# Patient Record
Sex: Male | Born: 1980 | Race: White | Hispanic: No | Marital: Married | Smoking: Former smoker
Health system: Southern US, Community
[De-identification: ages and names within clinical notes are randomized; demographics above are authoritative.]

## PROBLEM LIST (undated history)

## (undated) DIAGNOSIS — K219 Gastro-esophageal reflux disease without esophagitis: Secondary | ICD-10-CM

## (undated) DIAGNOSIS — F418 Other specified anxiety disorders: Secondary | ICD-10-CM

## (undated) HISTORY — DX: Gastro-esophageal reflux disease without esophagitis: K21.9

## (undated) HISTORY — DX: Other specified anxiety disorders: F41.8

---

## 2006-10-12 ENCOUNTER — Ambulatory Visit: Payer: Self-pay | Admitting: Family Medicine

## 2006-11-09 ENCOUNTER — Ambulatory Visit: Payer: Self-pay | Admitting: Family Medicine

## 2006-11-11 ENCOUNTER — Emergency Department (HOSPITAL_COMMUNITY): Admission: EM | Admit: 2006-11-11 | Discharge: 2006-11-12 | Payer: Self-pay | Admitting: Emergency Medicine

## 2006-11-13 ENCOUNTER — Emergency Department (HOSPITAL_COMMUNITY): Admission: EM | Admit: 2006-11-13 | Discharge: 2006-11-13 | Payer: Self-pay | Admitting: Emergency Medicine

## 2007-02-03 ENCOUNTER — Ambulatory Visit: Payer: Self-pay | Admitting: Family Medicine

## 2007-02-16 ENCOUNTER — Ambulatory Visit: Payer: Self-pay | Admitting: Family Medicine

## 2007-04-18 ENCOUNTER — Emergency Department (HOSPITAL_COMMUNITY): Admission: EM | Admit: 2007-04-18 | Discharge: 2007-04-19 | Payer: Self-pay | Admitting: *Deleted

## 2008-02-11 ENCOUNTER — Emergency Department (HOSPITAL_COMMUNITY): Admission: EM | Admit: 2008-02-11 | Discharge: 2008-02-11 | Payer: Self-pay | Admitting: Emergency Medicine

## 2008-04-03 ENCOUNTER — Emergency Department (HOSPITAL_COMMUNITY): Admission: EM | Admit: 2008-04-03 | Discharge: 2008-04-03 | Payer: Self-pay | Admitting: Family Medicine

## 2008-06-07 ENCOUNTER — Ambulatory Visit: Payer: Self-pay | Admitting: Occupational Medicine

## 2008-09-23 ENCOUNTER — Emergency Department (HOSPITAL_BASED_OUTPATIENT_CLINIC_OR_DEPARTMENT_OTHER): Admission: EM | Admit: 2008-09-23 | Discharge: 2008-09-23 | Payer: Self-pay | Admitting: Emergency Medicine

## 2009-06-07 ENCOUNTER — Ambulatory Visit: Payer: Self-pay | Admitting: Gastroenterology

## 2009-06-07 ENCOUNTER — Encounter (INDEPENDENT_AMBULATORY_CARE_PROVIDER_SITE_OTHER): Payer: Self-pay | Admitting: *Deleted

## 2009-06-07 DIAGNOSIS — K219 Gastro-esophageal reflux disease without esophagitis: Secondary | ICD-10-CM | POA: Insufficient documentation

## 2009-06-10 ENCOUNTER — Ambulatory Visit: Payer: Self-pay | Admitting: Occupational Medicine

## 2009-06-10 LAB — CONVERTED CEMR LAB: Rapid Strep: NEGATIVE

## 2009-06-19 ENCOUNTER — Ambulatory Visit: Payer: Self-pay | Admitting: Gastroenterology

## 2009-06-19 ENCOUNTER — Encounter: Payer: Self-pay | Admitting: Gastroenterology

## 2009-06-21 ENCOUNTER — Encounter: Payer: Self-pay | Admitting: Gastroenterology

## 2009-06-26 ENCOUNTER — Ambulatory Visit: Payer: Self-pay | Admitting: Family Medicine

## 2009-06-26 DIAGNOSIS — J019 Acute sinusitis, unspecified: Secondary | ICD-10-CM | POA: Insufficient documentation

## 2009-09-02 ENCOUNTER — Ambulatory Visit: Payer: Self-pay | Admitting: Family Medicine

## 2010-01-18 ENCOUNTER — Emergency Department (HOSPITAL_BASED_OUTPATIENT_CLINIC_OR_DEPARTMENT_OTHER): Admission: EM | Admit: 2010-01-18 | Discharge: 2010-01-18 | Payer: Self-pay | Admitting: Emergency Medicine

## 2010-01-18 ENCOUNTER — Encounter: Payer: Self-pay | Admitting: Family Medicine

## 2010-08-07 ENCOUNTER — Ambulatory Visit: Payer: Self-pay | Admitting: Emergency Medicine

## 2010-08-07 DIAGNOSIS — L738 Other specified follicular disorders: Secondary | ICD-10-CM | POA: Insufficient documentation

## 2010-08-09 ENCOUNTER — Emergency Department (HOSPITAL_BASED_OUTPATIENT_CLINIC_OR_DEPARTMENT_OTHER)
Admission: EM | Admit: 2010-08-09 | Discharge: 2010-08-09 | Payer: Self-pay | Source: Home / Self Care | Admitting: Emergency Medicine

## 2010-08-11 ENCOUNTER — Telehealth (INDEPENDENT_AMBULATORY_CARE_PROVIDER_SITE_OTHER): Payer: Self-pay | Admitting: *Deleted

## 2010-09-16 NOTE — Assessment & Plan Note (Signed)
Summary: VOMIT/DIARRHEA/TJ    Current Allergies: No known allergies  Assessment PATIENT PROCEEDED TO ER  The patient and/or caregiver has been counseled thoroughly with regard to medications prescribed including dosage, schedule, interactions, rationale for use, and possible side effects and they verbalize understanding.  Diagnoses and expected course of recovery discussed and will return if not improved as expected or if the condition worsens. Patient and/or caregiver verbalized understanding.

## 2010-09-18 NOTE — Assessment & Plan Note (Signed)
Summary: SORE ON TOP OF HEAD/TJ   Vital Signs:  Patient Profile:   30 Years Old Male O2 treatment:    Room Air (left arm) Cuff size:   regular  Vitals Entered By: Lajean Saver RN (August 07, 2010 5:48 PM)                  Updated Prior Medication List: NEXIUM 40 MG CPDR (ESOMEPRAZOLE MAGNESIUM) 1 tab by mouth once daily  Current Allergies: No known allergies History of Present Illness History of Present Illness: Sore on the L top of his head for 4 days.  He doesn't know how it got there.  Red and swollen with some bumps.  He does cut his own hair and cut it about a week ago.  Also used hair gel.  Then in the past 1-2 days has noticed L ear discomfort on the same side.  No F/C.  REVIEW OF SYSTEMS Constitutional Symptoms      Denies fever, chills, night sweats, weight loss, weight gain, and fatigue.  Eyes       Denies change in vision, eye pain, eye discharge, glasses, contact lenses, and eye surgery. Ear/Nose/Throat/Mouth       Complains of ear pain.      Denies hearing loss/aids, change in hearing, ear discharge, dizziness, frequent runny nose, frequent nose bleeds, sinus problems, sore throat, hoarseness, and tooth pain or bleeding.      Comments: left ear Respiratory       Denies dry cough, productive cough, wheezing, shortness of breath, asthma, bronchitis, and emphysema/COPD.  Cardiovascular       Denies murmurs, chest pain, and tires easily with exhertion.    Gastrointestinal       Denies stomach pain, nausea/vomiting, diarrhea, constipation, blood in bowel movements, and indigestion. Genitourniary       Denies painful urination, kidney stones, and loss of urinary control. Neurological       Denies paralysis, seizures, and fainting/blackouts. Musculoskeletal       Complains of redness and swelling.      Denies muscle pain, joint pain, joint stiffness, decreased range of motion, muscle weakness, and gout.      Comments: head Skin       Complains of unusual  moles/lumps or sores.      Denies bruising and hair/skin or nail changes.      Comments: head Psych       Denies mood changes, temper/anger issues, anxiety/stress, speech problems, depression, and sleep problems. Other Comments: Patient c/o sore to left frontal region of his head x 4 days. He is now having pain with eye movement and in front of his left ear   Past History:  Past Medical History: GERD  Past Surgical History: Reviewed history from 06/07/2009 and no changes required. None   Family History: Reviewed history from 06/07/2008 and no changes required. Mother- alive and healthy Family History Hypertension - Father deceased Family History of Cardiovascular disorder  Social History: quit dipping tobacco a year ago drinks a 12 pack in a week 4-5 caffinated drinks a day. married, children He works as a Government social research officer, traveling a lot on the weekends Physical Exam General appearance: well developed, well nourished, no acute distress Head: slightly raised erythematous area on L fronto-temporal area of head with mild irritation of hair follicles.  No induration or fluctuance is felt. Ears: normal, no lesions or deformities.  +Left anterior auricular LAD Skin: see above, no other rashes are seen MSE: oriented to time,  place, and person Assessment New Problems: FOLLICULITIS (ICD-704.8)  likely bacterial folliculitis, doubt fungal  Plan New Medications/Changes: DOXYCYCLINE HYCLATE 100 MG CAPS (DOXYCYCLINE HYCLATE) 1 by mouth two times a day for 10 days  #20 x 0, 08/07/2010, Hoyt Koch MD  New Orders: Est. Patient Level III [phone] Planning Comments:   Rx for Doxy given for 10 days.  Keep area clean and dry.  Hold off on using gel for this week.  May use OTC cortisone cream for itching/irritation as needed.  If not improving in the next few days, call PCP.    The patient and/or caregiver has been counseled thoroughly with regard to medications prescribed  including dosage, schedule, interactions, rationale for use, and possible side effects and they verbalize understanding.  Diagnoses and expected course of recovery discussed and will return if not improved as expected or if the condition worsens. Patient and/or caregiver verbalized understanding.  Prescriptions: DOXYCYCLINE HYCLATE 100 MG CAPS (DOXYCYCLINE HYCLATE) 1 by mouth two times a day for 10 days  #20 x 0   Entered and Authorized by:   Hoyt Koch MD   Signed by:   Hoyt Koch MD on 08/07/2010   Method used:   Print then Give to Patient   RxID:   [id number]   Orders Added: 1)  Est. Patient Level III [32440]  Appended Document: SORE ON TOP OF HEAD/TJ V/S: 98.9 F 144/87 HR: 69 RR:16 99% 171lbs

## 2010-09-18 NOTE — Progress Notes (Signed)
  Phone Note Outgoing Call   Call placed by: Lajean Saver RN,  August 11, 2010 5:40 PM Call placed to: Patient Summary of Call: Callback: No answer. Message left with reason for call and call back with any questions or concerns

## 2010-11-03 LAB — BASIC METABOLIC PANEL
CO2: 22 mEq/L (ref 19–32)
Calcium: 9.9 mg/dL (ref 8.4–10.5)
Creatinine, Ser: 1.6 mg/dL — ABNORMAL HIGH (ref 0.4–1.5)
GFR calc Af Amer: >60 mL/min (ref >60)
GFR calc non Af Amer: 51 mL/min — ABNORMAL LOW (ref >60)
Sodium: 145 mEq/L (ref 135–145)

## 2011-04-07 ENCOUNTER — Inpatient Hospital Stay (INDEPENDENT_AMBULATORY_CARE_PROVIDER_SITE_OTHER)
Admission: RE | Admit: 2011-04-07 | Discharge: 2011-04-07 | Disposition: A | Discharge status: Home / Self Care | Payer: 59 | Source: Ambulatory Visit | Attending: Emergency Medicine | Admitting: Emergency Medicine

## 2011-04-07 ENCOUNTER — Encounter: Payer: Self-pay | Admitting: Emergency Medicine

## 2011-04-07 DIAGNOSIS — R509 Fever, unspecified: Secondary | ICD-10-CM

## 2011-04-07 DIAGNOSIS — R5383 Other fatigue: Secondary | ICD-10-CM

## 2011-04-07 DIAGNOSIS — R5381 Other malaise: Secondary | ICD-10-CM

## 2011-04-07 DIAGNOSIS — J069 Acute upper respiratory infection, unspecified: Secondary | ICD-10-CM

## 2011-04-07 LAB — CONVERTED CEMR LAB: Rapid Strep: NEGATIVE

## 2011-04-28 ENCOUNTER — Other Ambulatory Visit: Payer: Self-pay | Admitting: Family Medicine

## 2011-04-28 ENCOUNTER — Ambulatory Visit (HOSPITAL_COMMUNITY)
Admission: RE | Admit: 2011-04-28 | Discharge: 2011-04-28 | Disposition: A | Discharge status: Home / Self Care | Payer: 59 | Source: Ambulatory Visit | Attending: Family Medicine | Admitting: Family Medicine

## 2011-04-28 ENCOUNTER — Encounter: Payer: Self-pay | Admitting: Family Medicine

## 2011-04-28 ENCOUNTER — Ambulatory Visit
Admission: RE | Admit: 2011-04-28 | Discharge: 2011-04-28 | Disposition: A | Discharge status: Home / Self Care | Payer: 59 | Source: Ambulatory Visit | Attending: Family Medicine | Admitting: Family Medicine

## 2011-04-28 ENCOUNTER — Inpatient Hospital Stay (INDEPENDENT_AMBULATORY_CARE_PROVIDER_SITE_OTHER)
Admission: RE | Admit: 2011-04-28 | Discharge: 2011-04-28 | Disposition: A | Discharge status: Home / Self Care | Payer: 59 | Source: Ambulatory Visit | Attending: Family Medicine | Admitting: Family Medicine

## 2011-04-28 DIAGNOSIS — R059 Cough, unspecified: Secondary | ICD-10-CM

## 2011-04-28 DIAGNOSIS — R05 Cough: Secondary | ICD-10-CM

## 2011-04-28 DIAGNOSIS — J209 Acute bronchitis, unspecified: Secondary | ICD-10-CM

## 2011-04-30 ENCOUNTER — Telehealth (INDEPENDENT_AMBULATORY_CARE_PROVIDER_SITE_OTHER): Payer: Self-pay | Admitting: Emergency Medicine

## 2011-05-04 ENCOUNTER — Encounter: Payer: Self-pay | Admitting: Family Medicine

## 2011-05-05 ENCOUNTER — Ambulatory Visit (INDEPENDENT_AMBULATORY_CARE_PROVIDER_SITE_OTHER): Payer: 59 | Admitting: Family Medicine

## 2011-05-05 ENCOUNTER — Encounter: Payer: Self-pay | Admitting: Family Medicine

## 2011-05-05 VITALS — BP 140/86 | HR 78 | Wt 156.0 lb

## 2011-05-05 DIAGNOSIS — J209 Acute bronchitis, unspecified: Secondary | ICD-10-CM

## 2011-05-05 DIAGNOSIS — S6980XA Other specified injuries of unspecified wrist, hand and finger(s), initial encounter: Secondary | ICD-10-CM

## 2011-05-05 DIAGNOSIS — IMO0001 Reserved for inherently not codable concepts without codable children: Secondary | ICD-10-CM

## 2011-05-05 DIAGNOSIS — S6990XA Unspecified injury of unspecified wrist, hand and finger(s), initial encounter: Secondary | ICD-10-CM

## 2011-05-05 MED ORDER — AMOXICILLIN-POT CLAVULANATE 875-125 MG PO TABS: 1.0000 | ORAL_TABLET | Freq: Two times a day (BID) | ORAL | Status: AC

## 2011-05-05 NOTE — Progress Notes (Signed)
  Subjective:    Patient ID: John Leach, male    DOB: 27-Jan-1981, 30 y.o.   MRN: [id number]  HPI He injured his left fourth finger trying to catch a football several weeks ago. He is now unable to fully extend at the DIP joint. He has been using a homemade splint to help with this. He also complains of a several week history of productive cough, congestion. He was seen twice in an urgent care Center. Initially he was placed on azithromycin. The chest x-ray was apparently negative. He was then switched to Biaxin. He has made no progress with his symptoms. He has no fever, chills, sore throat or earache. He does not smoke.   Review of Systems     Objective:   Physical Exam alert and in no distress. Tympanic membranes and canals are normal. Throat is clear. Tonsils are normal. Neck is supple without adenopathy or thyromegaly. Cardiac exam shows a regular sinus rhythm without murmurs or gallops. Lungs show scattered rhonchi especially on full expiration. Sam of the left ring finger does show inability to fully extend at the DIP joint. No tenderness to palpation or joint swelling is noted.       Assessment & Plan:   1. Bronchitis, acute    2. Injury of fourth finger of left hand  Ambulatory referral to Orthopedic Surgery   I will place him on Augmentin for 2 weeks he is to call me at that time and let me know he is doing. Continue to wear the splint and see orthopedics.

## 2011-07-06 ENCOUNTER — Emergency Department: Admit: 2011-07-06 | Discharge: 2011-07-06 | Disposition: A | Discharge status: Home / Self Care | Payer: 59

## 2011-07-06 ENCOUNTER — Emergency Department
Admission: EM | Admit: 2011-07-06 | Discharge: 2011-07-06 | Disposition: A | Discharge status: Home / Self Care | Payer: 59 | Source: Home / Self Care | Attending: Emergency Medicine | Admitting: Emergency Medicine

## 2011-07-06 ENCOUNTER — Encounter: Payer: Self-pay | Admitting: Emergency Medicine

## 2011-07-06 DIAGNOSIS — S92909A Unspecified fracture of unspecified foot, initial encounter for closed fracture: Secondary | ICD-10-CM

## 2011-07-06 DIAGNOSIS — M79609 Pain in unspecified limb: Secondary | ICD-10-CM

## 2011-07-06 DIAGNOSIS — M79671 Pain in right foot: Secondary | ICD-10-CM

## 2011-07-06 DIAGNOSIS — S92901A Unspecified fracture of right foot, initial encounter for closed fracture: Secondary | ICD-10-CM

## 2011-07-06 NOTE — ED Provider Notes (Signed)
History     CSN: [id number] Arrival date & time: 07/06/2011  8:33 AM   First MD Initiated Contact with Patient 07/06/11 [phone]      Chief Complaint  Patient presents with  . Foot Injury    (Consider location/radiation/quality/duration/timing/severity/associated sxs/prior treatment) Patient is a 30 y.o. male presenting with foot injury.  Foot Injury    This is a 30 year old male who presents with a right foot injury since yesterday.  He was standing on the stool and stepped off and twisted his ankle and landed on the lateral aspect of his foot.  Since then he has had pain.  This morning when he tried to get a bed and stepped on his foot, he felt a lot more pain.  He has been taking Tylenol and using a little ice which has been helping.  He does not have any history of any right foot trauma.  Past Medical History  Diagnosis Date  . GERD (gastroesophageal reflux disease)   . COPD (chronic obstructive pulmonary disease)     FAM HX    History reviewed. No pertinent past surgical history.  History reviewed. No pertinent family history.  History  Substance Use Topics  . Smoking status: Never Smoker   . Smokeless tobacco: Never Used  . Alcohol Use: Not on file      Review of Systems  Allergies  Review of patient's allergies indicates no known allergies.  Home Medications   Current Outpatient Rx  Name Route Sig Dispense Refill  . ACETAMINOPHEN 500 MG PO TABS Oral Take 500 mg by mouth every 6 (six) hours as needed.        BP 137/89  Pulse 75  Temp(Src) 99 F (37.2 C) (Oral)  Resp 12  Ht 5\' 9"  (1.753 m)  Wt 166 lb (75.297 kg)  BMI 24.51 kg/m2  SpO2 100%  Physical Exam  Nursing note and vitals reviewed. Constitutional: He is oriented to person, place, and time. He appears well-developed and well-nourished.  HENT:  Head: Normocephalic and atraumatic.  Neck: Neck supple.  Cardiovascular: Regular rhythm and normal heart sounds.   Pulmonary/Chest: Effort normal and  breath sounds normal. No respiratory distress.  Musculoskeletal:       R ankle/foot: FROM, +TTP 5th metatarsal and lateral foot and associated swelling.   No TTP medial/lateral malleolus, navicular, calcaneus, Achilles, proximal fibula.  No swelling.  No ecchymoses.  Distal neurovascular status is intact.   Neurological: He is alert and oriented to person, place, and time.  Skin: Skin is warm and dry.  Psychiatric: He has a normal mood and affect. His speech is normal.     ED Course  Procedures (including critical care time)  Labs Reviewed - No data to display No results found.   1. Right foot pain       MDM  An X-ray was ordered and read by the radiologist as "transverse proximal fracture of the 5th metatarsal".  This may be a Yetta Barre' fracture or is very close.  Either way, will put him NWB on crutches and in a boot and set him up with Dr. Margaretha Sheffield or Lajoyce Corners.  Encourage rest, ice, compression with ACE bandage, and elevation of injured body part. NSAIDs PRN.    Lily Kocher, MD 07/06/11 [phone]

## 2011-07-06 NOTE — ED Notes (Signed)
Rt foot injury yesterday slipped off a foot stool, iced and elevated took tylenol.

## 2011-07-20 NOTE — Progress Notes (Signed)
Summary: fever,muscle & joint pain...wse   Vital Signs:  Patient Profile:   30 Years Old Male CC:      fever, achy joints x 1 day Height:     68 inches Weight:      158 pounds O2 Sat:      100 % O2 treatment:    Room Air Temp:     101.1 degrees F oral Pulse rate:   68 / minute Resp:     16 per minute BP sitting:   144 / 91  (left arm) Cuff size:   regular  Vitals Entered By: Clemens Catholic LPN (April 07, 2011 6:22 PM)                  Updated Prior Medication List: No Medications Current Allergies (reviewed today): No known allergies History of Present Illness History from: patient Chief Complaint: fever, achy joints x 1 day History of Present Illness: 30 Years Old Male complains of onset of cold symptoms for 1 days.  Taji has been using Tylenol which is helping a little bit.  He has 2 young kids at home. No sore throat No cough No pleuritic pain No wheezing No nasal congestion No post-nasal drainage No sinus pain/pressure No chest congestion No itchy/red eyes No earache No hemoptysis No SOB +chills/sweats + fever No nausea No vomiting No abdominal pain No diarrhea No skin rashes + fatigue +myalgias +headache   REVIEW OF SYSTEMS Constitutional Symptoms       Complains of fever, chills, and night sweats.     Denies weight loss, weight gain, and fatigue.  Eyes       Denies change in vision, eye pain, eye discharge, glasses, contact lenses, and eye surgery. Ear/Nose/Throat/Mouth       Denies hearing loss/aids, change in hearing, ear pain, ear discharge, dizziness, frequent runny nose, frequent nose bleeds, sinus problems, sore throat, hoarseness, and tooth pain or bleeding.  Respiratory       Denies dry cough, productive cough, wheezing, shortness of breath, asthma, bronchitis, and emphysema/COPD.  Cardiovascular       Denies murmurs, chest pain, and tires easily with exhertion.    Gastrointestinal       Denies stomach pain, nausea/vomiting,  diarrhea, constipation, blood in bowel movements, and indigestion. Genitourniary       Denies painful urination, kidney stones, and loss of urinary control. Neurological       Complains of headaches.      Denies paralysis, seizures, and fainting/blackouts. Musculoskeletal       Complains of muscle pain and joint pain.      Denies joint stiffness, decreased range of motion, redness, swelling, muscle weakness, and gout.  Skin       Denies bruising, unusual mles/lumps or sores, and hair/skin or nail changes.  Psych       Denies mood changes, temper/anger issues, anxiety/stress, speech problems, depression, and sleep problems. Other Comments: pt c/o fever x last night, achy joints and LBP x today. no pain w/ urination. he has taken Tylenol.   Past History:  Past Medical History: Reviewed history from 08/07/2010 and no changes required. GERD  Past Surgical History: Reviewed history from 06/07/2009 and no changes required. None   Family History: Mother- alive and healthy Family History Hypertension - Father deceased [place] age 16 Family History of Cardiovascular disorder  Social History: quit dipping tobacco a year ago drinks 8-10 in a week 4-5 caffinated drinks a day. married, children He works as a Research scientist (life sciences)  Curator, traveling a lot on the weekends Physical Exam General appearance: well developed, well nourished, no acute distress Ears: normal, no lesions or deformities Nasal: mucosa pink, nonedematous, no septal deviation, turbinates normal Oral/Pharynx: clear PND, no erythema, no exudates Neck: ant cerv LAD nontender Chest/Lungs: no rales, wheezes, or rhonchi bilateral, breath sounds equal without effort Heart: regular rate and  rhythm, no murmur MSE: oriented to time, place, and person Assessment New Problems: FEVER (ICD-780.60) FATIGUE-MALAISE (ICD-780.79) UPPER RESPIRATORY INFECTION, ACUTE (ICD-465.9)   Plan New Medications/Changes: PREDNISONE (PAK) 10 MG TABS  (PREDNISONE) 6 day pack, use as directed  #1 x 0, 04/07/2011, Hoyt Koch MD  New Orders: Est. Patient Level III [40981] Rapid Strep [19147] Planning Comments:   1) NO antiobiotics given since likely viral.  Rapid strep negative.  Gave Rx for Pred instead.  Use nasal saline solution (over the counter) at least 3 times a day.Can take tylenol every 6 hours or motrin every 8 hours for pain or fever.  Follow up with your primary doctor  if no improvement in 5-7 days, sooner if increasing pain, fever, or new symptoms.  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   The patient and/or caregiver has been counseled thoroughly with regard to medications prescribed including dosage, schedule, interactions, rationale for use, and possible side effects and they verbalize understanding.  Diagnoses and expected course of recovery discussed and will return if not improved as expected or if the condition worsens. Patient and/or caregiver verbalized understanding.  Prescriptions: PREDNISONE (PAK) 10 MG TABS (PREDNISONE) 6 day pack, use as directed  #1 x 0   Entered and Authorized by:   Hoyt Koch MD   Signed by:   Hoyt Koch MD on 04/07/2011   Method used:   Print then Give to Patient   RxID:   [id number]   Orders Added: 1)  Est. Patient Level III [96295] 2)  Rapid Strep [28413]    Laboratory Results  Date/Time Received: April 07, 2011 6:39 PM  Date/Time Reported: April 07, 2011 6:39 PM   Other Tests  Rapid Strep: negative  Kit Test Internal QC: Negative   (Normal Range: Negative)

## 2011-07-20 NOTE — Progress Notes (Signed)
Summary: COUGH, CHEST CONGESTION...WSE rm 4   Vital Signs:  Patient Profile:   30 Years Old Male CC:      cough and chest congestion x 3 wks Height:     68 inches Weight:      159 pounds O2 Sat:      99 % O2 treatment:    Room Air Temp:     99.1 degrees F oral Pulse rate:   56 / minute Resp:     16 per minute BP sitting:   137 / 88  (left arm) Cuff size:   regular  Vitals Entered By: Clemens Catholic LPN (April 28, 2011 6:15 PM)                  Updated Prior Medication List: No Medications Current Allergies (reviewed today): No known allergies History of Present Illness Chief Complaint: cough and chest congestion x 3 wks History of Present Illness:  Subjective:  Patient complains of persistent non-productive cough for 3 weeks, generally worse at night.  He has occasional wheezing, and mild shortness of breath with activity.  No fevers, chills, and sweats.  He has had some persistent mild right nasal congestion.  REVIEW OF SYSTEMS Constitutional Symptoms      Denies fever, chills, night sweats, weight loss, weight gain, and fatigue.  Eyes       Denies change in vision, eye pain, eye discharge, glasses, contact lenses, and eye surgery. Ear/Nose/Throat/Mouth       Denies hearing loss/aids, change in hearing, ear pain, ear discharge, dizziness, frequent runny nose, frequent nose bleeds, sinus problems, sore throat, hoarseness, and tooth pain or bleeding.  Respiratory       Complains of dry cough, productive cough, and wheezing.      Denies shortness of breath, asthma, bronchitis, and emphysema/COPD.  Cardiovascular       Denies murmurs, chest pain, and tires easily with exhertion.      Comments: chest hurts   Gastrointestinal       Denies stomach pain, nausea/vomiting, diarrhea, constipation, blood in bowel movements, and indigestion. Genitourniary       Denies painful urination, kidney stones, and loss of urinary control. Neurological       Denies paralysis,  seizures, and fainting/blackouts. Musculoskeletal       Denies muscle pain, joint pain, joint stiffness, decreased range of motion, redness, swelling, muscle weakness, and gout.  Skin       Denies bruising, unusual mles/lumps or sores, and hair/skin or nail changes.  Psych       Denies mood changes, temper/anger issues, anxiety/stress, speech problems, depression, and sleep problems. Other Comments: pt c/o chest congestion, cough(mostly dry, when productive phelgm is green/dark yellow) x 3 wks. he has taken Mucinex and ASA with no relief.   Past History:  Past Medical History: Reviewed history from 08/07/2010 and no changes required. GERD  Past Surgical History: Reviewed history from 06/07/2009 and no changes required. None   Family History: Reviewed history from 04/07/2011 and no changes required. Mother- alive and healthy Family History Hypertension - Father deceased [place] age 25 Family History of Cardiovascular disorder  Social History: Reviewed history from 04/07/2011 and no changes required. quit dipping tobacco a year ago drinks 8-10 in a week 4-5 caffinated drinks a day. married, children He works as a Government social research officer, traveling a lot on the weekends   Objective:  Appearance:  Patient appears healthy, stated age, and in no acute distress  Eyes:  Pupils are equal,  round, and reactive to light and accomodation.  Extraocular movement is intact.  Conjunctivae are not inflamed.  Ears:  Canals normal.  Tympanic membranes normal.   Nose:  Mildly congested turbinates.  Mild right maxillary sinus tenderness  Pharynx:  Normal  Neck:  Supple.  No adenopathy is present.  Lungs:  Faint wheezes/rales left base.   Breath sounds are equal.  Heart:  Regular rate and rhythm without murmurs, rubs, or gallops.  Abdomen:  Nontender without masses or hepatosplenomegaly.  Bowel sounds are present.  No CVA or flank tenderness.  Extremities:  No edema. Skin:  No rash Chest X-ray:    IMPRESSION: Mild bronchial thickening, right greater than left suggestive of bronchitis. Assessment New Problems: BRONCHITIS, ACUTE (ICD-466.0) COUGH (ICD-786.2)   Plan New Medications/Changes: BENZONATATE 200 MG CAPS (BENZONATATE) One by mouth hs as needed cough  #12 x 0, 04/28/2011, Donna Christen MD CLARITHROMYCIN 500 MG TABS (CLARITHROMYCIN) One Tab by mouth two times a day  #20 x 0, 04/28/2011, Donna Christen MD  New Orders: T-Chest x-ray, 2 views [71020] Pulse Oximetry (single measurment) [94760] Services provided After hours-Weekends-Holidays [99051] Est. Patient Level IV [16109] Planning Comments:   Begin Biaxin, expectorant/decongestant, cough suppressant at bedtime.  Increase fluid intake Followup with PCP if not improving 7 to 10 days   The patient and/or caregiver has been counseled thoroughly with regard to medications prescribed including dosage, schedule, interactions, rationale for use, and possible side effects and they verbalize understanding.  Diagnoses and expected course of recovery discussed and will return if not improved as expected or if the condition worsens. Patient and/or caregiver verbalized understanding.  Prescriptions: BENZONATATE 200 MG CAPS (BENZONATATE) One by mouth hs as needed cough  #12 x 0   Entered and Authorized by:   Donna Christen MD   Signed by:   Donna Christen MD on 04/28/2011   Method used:   Print then Give to Patient   RxID:   [id number] CLARITHROMYCIN 500 MG TABS (CLARITHROMYCIN) One Tab by mouth two times a day  #20 x 0   Entered and Authorized by:   Donna Christen MD   Signed by:   Donna Christen MD on 04/28/2011   Method used:   Print then Give to Patient   RxID:   [id number]   Patient Instructions: 1)  Take Mucinex, or Mucinex D (guaifenesin with decongestant) twice daily for congestion. 2)  Increase fluid intake, rest. 3)  Followup with family doctor if not improving 7 to 10 days.   Orders Added: 1)  T-Chest  x-ray, 2 views [71020] 2)  Pulse Oximetry (single measurment) [94760] 3)  Services provided After hours-Weekends-Holidays [99051] 4)  Est. Patient Level IV [29528]

## 2011-07-20 NOTE — Telephone Encounter (Signed)
  Phone Note Outgoing Call   Call placed by: Lavell Islam RN,  April 30, 2011 6:10 PM Call placed to: Patient Action Taken: Phone Call Completed Summary of Call: Patient not feeling much better; discussed filling Rx for ABX and contacting PCP>

## 2011-08-10 ENCOUNTER — Other Ambulatory Visit (HOSPITAL_COMMUNITY): Payer: Self-pay | Admitting: Otolaryngology

## 2011-08-13 ENCOUNTER — Ambulatory Visit (HOSPITAL_COMMUNITY)
Admission: RE | Admit: 2011-08-13 | Discharge: 2011-08-13 | Disposition: A | Discharge status: Home / Self Care | Payer: 59 | Source: Ambulatory Visit | Attending: Otolaryngology | Admitting: Otolaryngology

## 2011-08-13 DIAGNOSIS — J322 Chronic ethmoidal sinusitis: Secondary | ICD-10-CM | POA: Insufficient documentation

## 2011-08-13 DIAGNOSIS — J342 Deviated nasal septum: Secondary | ICD-10-CM | POA: Insufficient documentation

## 2011-08-13 DIAGNOSIS — J3489 Other specified disorders of nose and nasal sinuses: Secondary | ICD-10-CM | POA: Insufficient documentation

## 2011-08-13 DIAGNOSIS — J320 Chronic maxillary sinusitis: Secondary | ICD-10-CM | POA: Insufficient documentation

## 2011-08-31 ENCOUNTER — Emergency Department
Admission: EM | Admit: 2011-08-31 | Discharge: 2011-08-31 | Disposition: A | Discharge status: Home / Self Care | Payer: 59 | Source: Home / Self Care

## 2011-08-31 DIAGNOSIS — Z23 Encounter for immunization: Secondary | ICD-10-CM

## 2011-08-31 MED ORDER — INFLUENZA VAC TYP A&B SURF ANT IM INJ
0.5000 mL | INJECTION | Freq: Once | INTRAMUSCULAR | Status: AC
  Administered 2011-08-31: 0.5 mL via INTRAMUSCULAR

## 2015-03-18 ENCOUNTER — Encounter: Payer: Self-pay | Admitting: *Deleted

## 2015-03-18 ENCOUNTER — Emergency Department (INDEPENDENT_AMBULATORY_CARE_PROVIDER_SITE_OTHER)
Admission: EM | Admit: 2015-03-18 | Discharge: 2015-03-18 | Disposition: A | Discharge status: Home / Self Care | Payer: BLUE CROSS/BLUE SHIELD | Source: Home / Self Care | Attending: Family Medicine | Admitting: Family Medicine

## 2015-03-18 DIAGNOSIS — J0120 Acute ethmoidal sinusitis, unspecified: Secondary | ICD-10-CM

## 2015-03-18 MED ORDER — PREDNISONE 20 MG PO TABS: 20.0000 mg | ORAL_TABLET | Freq: Two times a day (BID) | ORAL | Status: DC

## 2015-03-18 MED ORDER — AMOXICILLIN 875 MG PO TABS: 875.0000 mg | ORAL_TABLET | Freq: Two times a day (BID) | ORAL | Status: DC

## 2015-03-18 NOTE — ED Notes (Signed)
Pt c/o sinus pain and pressure x 1 wk. Denies fever or nasal congestion.

## 2015-03-18 NOTE — ED Provider Notes (Signed)
CSN: [id number]     Arrival date & time 03/18/15  1738 History   First MD Initiated Contact with Patient 03/18/15 1810     Chief Complaint  Patient presents with  . Facial Pain      HPI Comments: Patient complains of onset of pressure and mild pain over the right bridge of his nose radiating to below his right eye.  He has felt congested but has had no nasal drainage.  His right ear feels somewhat clogged.  No sore throat or URI symptoms.  No fevers, chills, and sweats.  He has been using saline irrigation.  The history is provided by the patient.    Past Medical History  Diagnosis Date  . GERD (gastroesophageal reflux disease)   . COPD (chronic obstructive pulmonary disease)     FAM HX   History reviewed. No pertinent past surgical history. Family History  Problem Relation Age of Onset  . Heart attack Father    History  Substance Use Topics  . Smoking status: Former Games developer  . Smokeless tobacco: Never Used  . Alcohol Use: Yes     Comment: 10-12    Review of Systems No sore throat No cough No pleuritic pain No wheezing + nasal congestion + post-nasal drainage + sinus pain/pressure No itchy/red eyes ? earache No hemoptysis No SOB No fever/chills No nausea No vomiting No abdominal pain No diarrhea No urinary symptoms No skin rash No fatigue No myalgias + headache Used OTC meds without relief  Allergies  Review of patient's allergies indicates no known allergies.  Home Medications   Prior to Admission medications   Medication Sig Start Date End Date Taking? Authorizing Provider  acetaminophen (TYLENOL) 500 MG tablet Take 500 mg by mouth every 6 (six) hours as needed.      Historical Provider, MD  amoxicillin (AMOXIL) 875 MG tablet Take 1 tablet (875 mg total) by mouth 2 (two) times daily. 03/18/15   Lattie Haw, MD  predniSONE (DELTASONE) 20 MG tablet Take 1 tablet (20 mg total) by mouth 2 (two) times daily. Take with food. 03/18/15   Lattie Haw, MD    BP 144/91 mmHg  Pulse 53  Temp(Src) 98.8 F (37.1 C) (Oral)  Resp 16  Ht  (1.753 m)  Wt 164 lb (74.39 kg)  BMI 24.21 kg/m2  SpO2 100% Physical Exam Nursing notes and Vital Signs reviewed. Appearance:  Patient appears stated age, and in no acute distress Eyes:  Pupils are equal, round, and reactive to light and accomodation.  Extraocular movement is intact.  Conjunctivae are not inflamed  Ears:  Canals normal.  Tympanic membranes normal.  Nose:  Mildly congested turbinates.  There is mild tenderness to palpation over the bridge of nose without swelling Pharynx:  Normal Neck:  Supple.  No adenopathy Skin:  No rash present.   ED Course  Procedures  none   MDM   1. Acute ethmoidal sinusitis, recurrence not specified     Begin Amoxicillin  BID for 10 days, and prednisone burst. May use Afrin nasal spray (or generic oxymetazoline) twice daily for about 5 days.  Also recommend using saline nasal spray several times daily and saline nasal irrigation (AYR is a common brand).   Stop all antihistamines for now, and other non-prescription cough/cold preparations. Followup with ENT if not resolved 10 days.    Lattie Haw, MD 03/18/15 [phone]

## 2015-03-18 NOTE — Discharge Instructions (Signed)
May use Afrin nasal spray (or generic oxymetazoline) twice daily for about 5 days.  Also recommend using saline nasal spray several times daily and saline nasal irrigation (AYR is a common brand).   Stop all antihistamines for now, and other non-prescription cough/cold preparations.   Sinusitis Sinusitis is redness, soreness, and inflammation of the paranasal sinuses. Paranasal sinuses are air pockets within the bones of your face (beneath the eyes, the middle of the forehead, or above the eyes). In healthy paranasal sinuses, mucus is able to drain out, and air is able to circulate through them by way of your nose. However, when your paranasal sinuses are inflamed, mucus and air can become trapped. This can allow bacteria and other germs to grow and cause infection. Sinusitis can develop quickly and last only a short time (acute) or continue over a long period (chronic). Sinusitis that lasts for more than 12 weeks is considered chronic.  CAUSES  Causes of sinusitis include:  Allergies.  Structural abnormalities, such as displacement of the cartilage that separates your nostrils (deviated septum), which can decrease the air flow through your nose and sinuses and affect sinus drainage.  Functional abnormalities, such as when the small hairs (cilia) that line your sinuses and help remove mucus do not work properly or are not present. SIGNS AND SYMPTOMS  Symptoms of acute and chronic sinusitis are the same. The primary symptoms are pain and pressure around the affected sinuses. Other symptoms include:  Upper toothache.  Earache.  Headache.  Bad breath.  Decreased sense of smell and taste.  A cough, which worsens when you are lying flat.  Fatigue.  Fever.  Thick drainage from your nose, which often is green and may contain pus (purulent).  Swelling and warmth over the affected sinuses. DIAGNOSIS  Your health care provider will perform a physical exam. During the exam, your health  care provider may:  Look in your nose for signs of abnormal growths in your nostrils (nasal polyps).  Tap over the affected sinus to check for signs of infection.  View the inside of your sinuses (endoscopy) using an imaging device that has a light attached (endoscope). If your health care provider suspects that you have chronic sinusitis, one or more of the following tests may be recommended:  Allergy tests.  Nasal culture. A sample of mucus is taken from your nose, sent to a lab, and screened for bacteria.  Nasal cytology. A sample of mucus is taken from your nose and examined by your health care provider to determine if your sinusitis is related to an allergy. TREATMENT  Most cases of acute sinusitis are related to a viral infection and will resolve on their own within 10 days. Sometimes medicines are prescribed to help relieve symptoms (pain medicine, decongestants, nasal steroid sprays, or saline sprays).  However, for sinusitis related to a bacterial infection, your health care provider will prescribe antibiotic medicines. These are medicines that will help kill the bacteria causing the infection.  Rarely, sinusitis is caused by a fungal infection. In theses cases, your health care provider will prescribe antifungal medicine. For some cases of chronic sinusitis, surgery is needed. Generally, these are cases in which sinusitis recurs more than 3 times per year, despite other treatments. HOME CARE INSTRUCTIONS   Drink plenty of water. Water helps thin the mucus so your sinuses can drain more easily.  Use a humidifier.  Inhale steam 3 to 4 times a day (for example, sit in the bathroom with the shower running).  Apply a warm, moist washcloth to your face 3 to 4 times a day, or as directed by your health care provider.  Use saline nasal sprays to help moisten and clean your sinuses.  Take medicines only as directed by your health care provider.  If you were prescribed either an  antibiotic or antifungal medicine, finish it all even if you start to feel better. SEEK IMMEDIATE MEDICAL CARE IF:  You have increasing pain or severe headaches.  You have nausea, vomiting, or drowsiness.  You have swelling around your face.  You have vision problems.  You have a stiff neck.  You have difficulty breathing. MAKE SURE YOU:   Understand these instructions.  Will watch your condition.  Will get help right away if you are not doing well or get worse. Document Released: 08/03/2005 Document Revised: 12/18/2013 Document Reviewed: 08/18/2011 [facility] Patient Information 2015 [place], [place]. This information is not intended to replace advice given to you by your health care provider. Make sure you discuss any questions you have with your health care provider.

## 2016-02-21 MED FILL — AMOX-CLAV 875-125 MG TABLET: 875-125 | 10 days supply | Qty: 20 | Fill #0

## 2016-08-07 ENCOUNTER — Telehealth: Payer: BLUE CROSS/BLUE SHIELD | Admitting: Family

## 2016-08-07 DIAGNOSIS — J019 Acute sinusitis, unspecified: Secondary | ICD-10-CM

## 2016-08-07 MED ORDER — AMOXICILLIN-POT CLAVULANATE 875-125 MG PO TABS: 1.0000 | ORAL_TABLET | Freq: Two times a day (BID) | ORAL | 0 refills | Status: DC

## 2016-08-07 MED FILL — AMOX-CLAV 875-125 MG TABLET: 875-125 | 7 days supply | Qty: 14 | Fill #0

## 2016-08-07 NOTE — Progress Notes (Signed)

## 2016-10-01 ENCOUNTER — Telehealth: Payer: BLUE CROSS/BLUE SHIELD | Admitting: Physician Assistant

## 2016-10-01 DIAGNOSIS — R6889 Other general symptoms and signs: Secondary | ICD-10-CM

## 2016-10-01 NOTE — Progress Notes (Signed)

## 2016-10-06 ENCOUNTER — Encounter: Payer: Self-pay | Admitting: Family Medicine

## 2016-10-06 ENCOUNTER — Ambulatory Visit (INDEPENDENT_AMBULATORY_CARE_PROVIDER_SITE_OTHER): Payer: 59 | Admitting: Family Medicine

## 2016-10-06 VITALS — BP 132/90 | HR 78 | Ht 68.5 in | Wt 174.0 lb

## 2016-10-06 DIAGNOSIS — Z87891 Personal history of nicotine dependence: Secondary | ICD-10-CM | POA: Insufficient documentation

## 2016-10-06 DIAGNOSIS — K219 Gastro-esophageal reflux disease without esophagitis: Secondary | ICD-10-CM | POA: Diagnosis not present

## 2016-10-06 DIAGNOSIS — Z8249 Family history of ischemic heart disease and other diseases of the circulatory system: Secondary | ICD-10-CM | POA: Diagnosis not present

## 2016-10-06 DIAGNOSIS — Z0000 Encounter for general adult medical examination without abnormal findings: Secondary | ICD-10-CM | POA: Diagnosis not present

## 2016-10-06 DIAGNOSIS — F329 Major depressive disorder, single episode, unspecified: Secondary | ICD-10-CM | POA: Diagnosis not present

## 2016-10-06 DIAGNOSIS — F32A Depression, unspecified: Secondary | ICD-10-CM

## 2016-10-06 LAB — LIPID PANEL
CHOL/HDL RATIO: 4 ratio (ref <5.0)
Cholesterol: 153 mg/dL (ref <200)
HDL: 38 mg/dL — AB (ref >40)
LDL CALC: 100 mg/dL — AB (ref <100)
Triglycerides: 76 mg/dL (ref <150)
VLDL: 15 mg/dL (ref <30)

## 2016-10-06 LAB — COMPREHENSIVE METABOLIC PANEL
ALK PHOS: 49 U/L (ref 40–115)
ALT: 16 U/L (ref 9–46)
AST: 20 U/L (ref 10–40)
Albumin: 4.4 g/dL (ref 3.6–5.1)
BILIRUBIN TOTAL: 0.4 mg/dL (ref 0.2–1.2)
BUN: 12 mg/dL (ref 7–25)
CALCIUM: 9.1 mg/dL (ref 8.6–10.3)
CO2: 26 mmol/L (ref 20–31)
Chloride: 103 mmol/L (ref 98–110)
Creat: 0.96 mg/dL (ref 0.60–1.35)
GLUCOSE: 90 mg/dL (ref 65–99)
Potassium: 3.9 mmol/L (ref 3.5–5.3)
Sodium: 138 mmol/L (ref 135–146)
Total Protein: 6.9 g/dL (ref 6.1–8.1)

## 2016-10-06 LAB — CBC WITH DIFFERENTIAL/PLATELET
BASOS PCT: 0 %
Basophils Absolute: 0 cells/uL (ref 0–200)
EOS PCT: 1 %
Eosinophils Absolute: 47 cells/uL (ref 15–500)
HEMATOCRIT: 42 % (ref 38.5–50.0)
Hemoglobin: 14.7 g/dL (ref 13.2–17.1)
LYMPHS PCT: 33 %
Lymphs Abs: 1551 cells/uL (ref 850–3900)
MCH: 30.6 pg (ref 27.0–33.0)
MCHC: 35 g/dL (ref 32.0–36.0)
MCV: 87.3 fL (ref 80.0–100.0)
MONOS PCT: 7 %
MPV: 9.7 fL (ref 7.5–12.5)
Monocytes Absolute: 329 cells/uL (ref 200–950)
NEUTROS PCT: 59 %
Neutro Abs: 2773 cells/uL (ref 1500–7800)
PLATELETS: 192 10*3/uL (ref 140–400)
RBC: 4.81 MIL/uL (ref 4.20–5.80)
RDW: 13 % (ref 11.0–15.0)
WBC: 4.7 10*3/uL (ref 4.0–10.5)

## 2016-10-06 MED ORDER — CITALOPRAM HYDROBROMIDE 20 MG PO TABS: 20.0000 mg | ORAL_TABLET | Freq: Every day | ORAL | 1 refills | Status: DC

## 2016-10-06 MED FILL — CITALOPRAM HBR 20 MG TABLET: 20 | 30 days supply | Qty: 30 | Fill #0

## 2016-10-06 NOTE — Patient Instructions (Signed)
Call GrimesDarryl Hiers, [phone]

## 2016-10-06 NOTE — Progress Notes (Signed)
   Subjective:    Patient ID: John FiedlerBryan A Langner, male    DOB: 10-Aug-1981, 36 y.o.   MRN: [id number]  HPI He is here for complete examination. He has a 3 year history of increasing difficulty with his mood. He admits to having a low mood, worrying, difficulty with focus occasionally feeling overwhelmed as well as having sleep disturbance. On several occasions he is actually stayed home from work. He cannot relate this to any particular incidence of his life. His work and home life are going well. Underlying reflux disease but not having much trouble with that. He has remote history of smoking. Family history is significant with father dying of a heart attack at age 37. No underlying allergies, does not smoke. Family and social history as well as health maintenance and immunizations was otherwise reviewed.   Review of Systems  All other systems reviewed and are negative.      Objective:   Physical Exam BP 132/90   Pulse 78   Ht 5' 8.5" (1.74 m)   Wt 174 lb (78.9 kg)   SpO2 99%   BMI 26.07 kg/m   General Appearance:    Alert, cooperative, no distress, appears stated age  Head:    Normocephalic, without obvious abnormality, atraumatic  Eyes:    PERRL, conjunctiva/corneas clear, EOM's intact, fundi    benign  Ears:    Normal TM's and external ear canals  Nose:   Nares normal, mucosa normal, no drainage or sinus   tenderness  Throat:   Lips, mucosa, and tongue normal; teeth and gums normal  Neck:   Supple, no lymphadenopathy;  thyroid:  no   enlargement/tenderness/nodules; no carotid   bruit or JVD     Lungs:     Clear to auscultation bilaterally without wheezes, rales or     ronchi; respirations unlabored      Heart:    Regular rate and rhythm, S1 and S2 normal, no murmur, rub   or gallop     Abdomen:     Soft, non-tender, nondistended, normoactive bowel sounds,    no masses, no hepatosplenomegaly  Genitalia:    Normal male external genitalia without lesions.  Testicles without  masses.  No inguinal hernias.  Rectal:   Deferred due to age <40 and lack of symptoms  Extremities:   No clubbing, cyanosis or edema  Pulses:   2+ and symmetric all extremities  Skin:   Skin color, texture, turgor normal, no rashes or lesions  Lymph nodes:   Cervical, supraclavicular, and axillary nodes normal  Neurologic:   CNII-XII intact, normal strength, sensation and gait; reflexes 2+ and symmetric throughout          Psych:   Normal mood, affect, hygiene and grooming.          Assessment & Plan:  Routine general medical examination at a health care facility - Plan: CBC with Differential/Platelet, Comprehensive metabolic panel, Lipid panel  Former smoker  Gastroesophageal reflux disease without esophagitis  Depression, unspecified depression type - Plan: citalopram (CELEXA) 20 MG tablet I discussed the symptoms he is having an effect that it definitely indicates that he is depressed. Discussed getting counseling on this and he is interested Inc. getting involved with that. He will check with his insurance but I also gave the name of Darryl Hiers. He'll be started on Celexa and recommend he return here in one month for follow-up.

## 2016-11-03 ENCOUNTER — Ambulatory Visit: Payer: 59 | Admitting: Family Medicine

## 2016-11-03 MED FILL — CITALOPRAM HBR 20 MG TABLET: 20 | 30 days supply | Qty: 30 | Fill #1

## 2016-11-10 ENCOUNTER — Ambulatory Visit (INDEPENDENT_AMBULATORY_CARE_PROVIDER_SITE_OTHER): Payer: 59 | Admitting: Family Medicine

## 2016-11-10 ENCOUNTER — Encounter: Payer: Self-pay | Admitting: Family Medicine

## 2016-11-10 VITALS — BP 130/88 | HR 83 | Wt 177.0 lb

## 2016-11-10 DIAGNOSIS — F329 Major depressive disorder, single episode, unspecified: Secondary | ICD-10-CM

## 2016-11-10 DIAGNOSIS — F32A Depression, unspecified: Secondary | ICD-10-CM

## 2016-11-10 MED ORDER — CITALOPRAM HYDROBROMIDE 40 MG PO TABS: 40.0000 mg | ORAL_TABLET | Freq: Every day | ORAL | 1 refills | Status: DC

## 2016-11-10 MED FILL — CITALOPRAM HBR 40 MG TABLET: 40 | 30 days supply | Qty: 30 | Fill #0

## 2016-11-10 NOTE — Progress Notes (Signed)
   Subjective:    Patient ID: John FiedlerBryan A Leach, male    DOB: 10-Aug-1981, 36 y.o.   MRN: [id number]  HPI He is here for recheck. He does state that he has noted an improvement in his mood and more positive attitude. He was equivocal as to how much better he was feeling but definitely stated an improvement was noted. His wife also has noted this. He has not gotten involved in counseling. He states that he was waiting for his wife to find out to insurance, a good therapist.   Review of Systems     Objective:   Physical Exam Alert and in no distress with appropriate affect       Assessment & Plan:  Depression, unspecified depression type - Plan: citalopram (CELEXA) 40 MG tablet I'll increase his Celexa to 40 mg. Strongly encouraged him to get involved in counseling to help deal with where this is all coming from. Recheck here in roughly 1 month.

## 2016-12-14 ENCOUNTER — Ambulatory Visit: Payer: 59 | Admitting: Family Medicine

## 2016-12-16 MED FILL — CITALOPRAM HBR 40 MG TABLET: 40 | 30 days supply | Qty: 30 | Fill #1

## 2016-12-18 ENCOUNTER — Encounter: Payer: Self-pay | Admitting: Family Medicine

## 2017-01-07 ENCOUNTER — Other Ambulatory Visit: Payer: Self-pay | Admitting: Family Medicine

## 2017-01-07 DIAGNOSIS — F329 Major depressive disorder, single episode, unspecified: Secondary | ICD-10-CM

## 2017-01-07 DIAGNOSIS — F32A Depression, unspecified: Secondary | ICD-10-CM

## 2017-01-07 NOTE — Telephone Encounter (Signed)
Have him schedule an appointment 

## 2017-01-07 NOTE — Telephone Encounter (Signed)
Is this okay to refill? 

## 2017-01-08 NOTE — Telephone Encounter (Signed)
Called and left message on pt's vm to call back and schedule an appt with Dr. Susann GivensLalonde

## 2017-01-12 MED FILL — CITALOPRAM HBR 40 MG TABLET: 40 | 30 days supply | Qty: 30 | Fill #0

## 2017-02-01 ENCOUNTER — Telehealth: Payer: Self-pay

## 2017-02-01 NOTE — Telephone Encounter (Signed)
Pre Visit Call Completed. 

## 2017-02-02 ENCOUNTER — Encounter: Payer: Self-pay | Admitting: Family

## 2017-02-02 ENCOUNTER — Ambulatory Visit (INDEPENDENT_AMBULATORY_CARE_PROVIDER_SITE_OTHER): Payer: 59 | Admitting: Family

## 2017-02-02 VITALS — BP 129/84 | HR 69 | Temp 98.7°F | Resp 16 | Ht 68.5 in | Wt 179.0 lb

## 2017-02-02 DIAGNOSIS — F32A Depression, unspecified: Secondary | ICD-10-CM

## 2017-02-02 DIAGNOSIS — Z0000 Encounter for general adult medical examination without abnormal findings: Secondary | ICD-10-CM | POA: Diagnosis not present

## 2017-02-02 DIAGNOSIS — K219 Gastro-esophageal reflux disease without esophagitis: Secondary | ICD-10-CM | POA: Diagnosis not present

## 2017-02-02 DIAGNOSIS — F418 Other specified anxiety disorders: Secondary | ICD-10-CM | POA: Diagnosis not present

## 2017-02-02 DIAGNOSIS — F329 Major depressive disorder, single episode, unspecified: Secondary | ICD-10-CM

## 2017-02-02 LAB — CBC WITH DIFFERENTIAL/PLATELET
BASOS ABS: 0.1 10*3/uL (ref 0.0–0.1)
BASOS PCT: 1.5 % (ref 0.0–3.0)
EOS ABS: 0.1 10*3/uL (ref 0.0–0.7)
Eosinophils Relative: 2.2 % (ref 0.0–5.0)
HEMATOCRIT: 43.9 % (ref 39.0–52.0)
Hemoglobin: 14.8 g/dL (ref 13.0–17.0)
LYMPHS ABS: 1.2 10*3/uL (ref 0.7–4.0)
LYMPHS PCT: 23.5 % (ref 12.0–46.0)
MCHC: 33.7 g/dL (ref 30.0–36.0)
MCV: 90.9 fl (ref 78.0–100.0)
MONO ABS: 0.4 10*3/uL (ref 0.1–1.0)
Monocytes Relative: 8 % (ref 3.0–12.0)
NEUTROS ABS: 3.2 10*3/uL (ref 1.4–7.7)
NEUTROS PCT: 64.8 % (ref 43.0–77.0)
PLATELETS: 183 10*3/uL (ref 150.0–400.0)
RBC: 4.83 Mil/uL (ref 4.22–5.81)
RDW: 13.6 % (ref 11.5–15.5)
WBC: 4.9 10*3/uL (ref 4.0–10.5)

## 2017-02-02 LAB — URINALYSIS, ROUTINE W REFLEX MICROSCOPIC
BILIRUBIN URINE: NEGATIVE
Hgb urine dipstick: NEGATIVE
KETONES UR: NEGATIVE
Leukocytes, UA: NEGATIVE
Nitrite: NEGATIVE
RBC / HPF: NONE SEEN (ref 0–?)
SPECIFIC GRAVITY, URINE: 1.01 (ref 1.000–1.030)
Total Protein, Urine: NEGATIVE
UROBILINOGEN UA: 0.2 (ref 0.0–1.0)
Urine Glucose: NEGATIVE
pH: 7.5 (ref 5.0–8.0)

## 2017-02-02 LAB — BASIC METABOLIC PANEL
BUN: 17 mg/dL (ref 6–23)
CALCIUM: 8.9 mg/dL (ref 8.4–10.5)
CHLORIDE: 103 meq/L (ref 96–112)
CO2: 28 meq/L (ref 19–32)
CREATININE: 0.98 mg/dL (ref 0.40–1.50)
GFR: 91.47 mL/min (ref >60.00)
GLUCOSE: 93 mg/dL (ref 70–99)
Potassium: 4.4 mEq/L (ref 3.5–5.1)
Sodium: 137 mEq/L (ref 135–145)

## 2017-02-02 LAB — TSH: TSH: 2.15 u[IU]/mL (ref 0.35–4.50)

## 2017-02-02 LAB — LIPID PANEL
CHOL/HDL RATIO: 3
Cholesterol: 182 mg/dL (ref 0–200)
HDL: 61.5 mg/dL (ref >39.00)
LDL Cholesterol: 109 mg/dL — ABNORMAL HIGH (ref 0–99)
NONHDL: 120.9
TRIGLYCERIDES: 58 mg/dL (ref 0.0–149.0)
VLDL: 11.6 mg/dL (ref 0.0–40.0)

## 2017-02-02 LAB — HEPATIC FUNCTION PANEL
ALK PHOS: 48 U/L (ref 39–117)
ALT: 23 U/L (ref 0–53)
AST: 25 U/L (ref 0–37)
Albumin: 4.2 g/dL (ref 3.5–5.2)
BILIRUBIN DIRECT: 0.2 mg/dL (ref 0.0–0.3)
BILIRUBIN TOTAL: 0.6 mg/dL (ref 0.2–1.2)
TOTAL PROTEIN: 6.4 g/dL (ref 6.0–8.3)

## 2017-02-02 MED ORDER — CITALOPRAM HYDROBROMIDE 40 MG PO TABS
40.0000 mg | ORAL_TABLET | Freq: Every day | ORAL | 1 refills | Status: DC
Start: 2017-02-02 — End: 2017-08-05

## 2017-02-02 NOTE — Progress Notes (Signed)
Subjective:    Patient ID: John Leach, male    DOB: 1981/05/09, 36 y.o.   MRN: [id number]  HPI  Mr. Mesta is a 36 yr old male who presents today to establish care.   Depression/anxiety- He reports that he typically worried too much about things as a child and a young adult.  Reports that he used to have panic attacks. These are not occurring on citalopram. Reports anxiety is well controlled and mood is good.   GERD-used to take nexium.  Changed his diet.  Preventive-  care Immunizations: Tetanus up to date Diet- reports healthy diet Exercise- runs 3-4 times a week Vision- due Dental: due    Review of Systems  Constitutional: Negative for unexpected weight change.  HENT: Negative for hearing loss and rhinorrhea.   Eyes: Negative for visual disturbance.  Respiratory: Negative for cough.   Cardiovascular: Negative for leg swelling.  Gastrointestinal: Negative for blood in stool, constipation and diarrhea.  Genitourinary: Negative for frequency and hematuria.  Musculoskeletal: Negative for arthralgias and myalgias.  Skin: Negative for rash.  Neurological: Negative for headaches.  Psychiatric/Behavioral:       See HPI   Past Medical History:  Diagnosis Date  . GERD (gastroesophageal reflux disease)      Social History   Social History  . Marital status: Married    Spouse name: N/A  . Number of children: N/A  . Years of education: N/A   Occupational History  . Not on file.   Social History Main Topics  . Smoking status: Former Smoker    Quit date: 10/07/2007  . Smokeless tobacco: Never Used  . Alcohol use Yes     Comment: 10-12, drinks more on weekend  . Drug use: No  . Sexual activity: Yes   Other Topics Concern  . Not on file   Social History Narrative  . No narrative on file    History reviewed. No pertinent surgical history.  Family History  Problem Relation Age of Onset  . Heart attack Father   . Heart disease Father 81       MI  .  CAD Maternal Grandfather   . Cancer Maternal Grandfather   . Colon cancer Paternal Grandfather        died at 2    No Known Allergies  Current Outpatient Prescriptions on File Prior to Visit  Medication Sig Dispense Refill  . citalopram (CELEXA) 40 MG tablet TAKE 1 TABLET (40 MG TOTAL) BY MOUTH DAILY. 30 tablet 1   No current facility-administered medications on file prior to visit.     BP 129/84 (BP Location: Left Arm, Cuff Size: Normal)   Pulse 69   Temp 98.7 F (37.1 C) (Oral)   Resp 16   Ht 5' 8.5" (1.74 m)   Wt 179 lb (81.2 kg)   SpO2 99%   BMI 26.82 kg/m       Objective:   Physical Exam Physical Exam  Constitutional: He is oriented to person, place, and time. He appears well-developed and well-nourished. No distress.  HENT:  Head: Normocephalic and atraumatic.  Right Ear: Tympanic membrane and ear canal normal.  Left Ear: Tympanic membrane and ear canal normal.  Mouth/Throat: Oropharynx is clear and moist.  Eyes: Pupils are equal, round, and reactive to light. No scleral icterus.  Neck: Normal range of motion. No thyromegaly present.  Cardiovascular: Normal rate and regular rhythm.   No murmur heard. Pulmonary/Chest: Effort normal and breath sounds normal. No respiratory  distress. He has no wheezes. He has no rales. He exhibits no tenderness.  Abdominal: Soft. Bowel sounds are normal. He exhibits no distension and no mass. There is no tenderness. There is no rebound and no guarding.  Musculoskeletal: He exhibits no edema.  Lymphadenopathy:    He has no cervical adenopathy.  Neurological: He is alert and oriented to person, place, and time. He has normal patellar reflexes. He exhibits normal muscle tone. Coordination normal.  Skin: Skin is warm and dry.  Psychiatric: He has a normal mood and affect. His behavior is normal. Judgment and thought content normal.           Assessment & Plan:   Preventative care- discussed healthy diet, continuing regular  exercise. Goal weight for him will be about 165.  Immunizations reviewed and up to date.  Obtain routine lab work. His dad had MI at 6350 so will want his cholesterol at goal.  We did discuss limiting his alcohol to 2 or less/day. Advised pt to schedule routine dental and vision checks.   Depression/anxiety- stable. Continue current dose of citalopram.   GERD- stable with dietary changes, monitor.        Assessment & Plan:

## 2017-02-02 NOTE — Patient Instructions (Addendum)
Please schedule routine vision and dental exams.   Continue healthy diet and regular exercise.  Try to limit alcohol to 2 or less drinks/day.

## 2017-02-03 ENCOUNTER — Encounter: Payer: Self-pay | Admitting: Family

## 2017-02-24 MED FILL — CITALOPRAM HBR 40 MG TABLET: 40 | 30 days supply | Qty: 30 | Fill #1

## 2017-03-18 MED FILL — CITALOPRAM HBR 40 MG TABLET: 40 | 90 days supply | Qty: 90 | Fill #0

## 2017-07-06 MED FILL — CITALOPRAM HBR 40 MG TABLET: 40 | 90 days supply | Qty: 90 | Fill #1

## 2017-08-04 ENCOUNTER — Encounter: Payer: Self-pay | Admitting: Family

## 2017-08-04 ENCOUNTER — Ambulatory Visit (INDEPENDENT_AMBULATORY_CARE_PROVIDER_SITE_OTHER): Payer: 59 | Admitting: Family

## 2017-08-04 VITALS — BP 120/80 | HR 52 | Temp 98.7°F | Resp 18 | Ht 68.5 in | Wt 198.4 lb

## 2017-08-04 DIAGNOSIS — R635 Abnormal weight gain: Secondary | ICD-10-CM | POA: Diagnosis not present

## 2017-08-04 DIAGNOSIS — Z23 Encounter for immunization: Secondary | ICD-10-CM | POA: Diagnosis not present

## 2017-08-04 DIAGNOSIS — F419 Anxiety disorder, unspecified: Secondary | ICD-10-CM | POA: Diagnosis not present

## 2017-08-04 DIAGNOSIS — F329 Major depressive disorder, single episode, unspecified: Secondary | ICD-10-CM | POA: Diagnosis not present

## 2017-08-04 DIAGNOSIS — R5383 Other fatigue: Secondary | ICD-10-CM

## 2017-08-04 DIAGNOSIS — F32A Depression, unspecified: Secondary | ICD-10-CM

## 2017-08-04 LAB — CBC WITH DIFFERENTIAL/PLATELET
Basophils Absolute: 0.1 10*3/uL (ref 0.0–0.1)
Basophils Relative: 1.5 % (ref 0.0–3.0)
EOS PCT: 2.5 % (ref 0.0–5.0)
Eosinophils Absolute: 0.1 10*3/uL (ref 0.0–0.7)
HCT: 45.1 % (ref 39.0–52.0)
HEMOGLOBIN: 15.1 g/dL (ref 13.0–17.0)
Lymphocytes Relative: 28.8 % (ref 12.0–46.0)
Lymphs Abs: 1.1 10*3/uL (ref 0.7–4.0)
MCHC: 33.5 g/dL (ref 30.0–36.0)
MCV: 91.7 fl (ref 78.0–100.0)
MONO ABS: 0.3 10*3/uL (ref 0.1–1.0)
MONOS PCT: 8.9 % (ref 3.0–12.0)
Neutro Abs: 2.2 10*3/uL (ref 1.4–7.7)
Neutrophils Relative %: 58.3 % (ref 43.0–77.0)
Platelets: 177 10*3/uL (ref 150.0–400.0)
RBC: 4.92 Mil/uL (ref 4.22–5.81)
RDW: 12.7 % (ref 11.5–15.5)
WBC: 3.8 10*3/uL — AB (ref 4.0–10.5)

## 2017-08-04 LAB — TSH: TSH: 2.23 u[IU]/mL (ref 0.35–4.50)

## 2017-08-04 NOTE — Patient Instructions (Addendum)
Please complete lab work prior to leaving.   We will contact you with further recommendations.

## 2017-08-04 NOTE — Progress Notes (Signed)
Subjective:    Patient ID: John FiedlerBryan A Leach, male    DOB: 05-28-1981, 36 y.o.   MRN: [id number]  HPI   Pt is a 36 yr old male who presents today for follow up.  Anxiety/Depression- maintained on citalopram 40mg .  + fatigue.  Diet has not changed.  + exercise.  He first started the citalopram in February early March 2018.  He reports that his mood is good and anxiety is well controlled. Wt Readings from Last 3 Encounters:  08/04/17 198 lb 6.4 oz (90 kg)  02/02/17 179 lb (81.2 kg)  11/10/16 177 lb (80.3 kg)        Review of Systems See HPI  Past Medical History:  Diagnosis Date  . Depression with anxiety   . GERD (gastroesophageal reflux disease)      Social History   Socioeconomic History  . Marital status: Married    Spouse name: Not on file  . Number of children: Not on file  . Years of education: Not on file  . Highest education level: Not on file  Social Needs  . Financial resource strain: Not on file  . Food insecurity - worry: Not on file  . Food insecurity - inability: Not on file  . Transportation needs - medical: Not on file  . Transportation needs - non-medical: Not on file  Occupational History  . Not on file  Tobacco Use  . Smoking status: Former Smoker    Last attempt to quit: 10/07/2007    Years since quitting: 9.8  . Smokeless tobacco: Never Used  Substance and Sexual Activity  . Alcohol use: Yes    Comment: 10-12, drinks more on weekend  . Drug use: No  . Sexual activity: Yes  Other Topics Concern  . 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 No pertinent surgical history.  Family History  Problem Relation Age of Onset  . Heart attack Father   . Heart disease Father 1650       MI  . CAD Maternal Grandfather   . Cancer Maternal Grandfather   . Colon cancer Paternal Grandfather        died at 5672    No Known  Allergies  Current Outpatient Medications on File Prior to Visit  Medication Sig Dispense Refill  . citalopram (CELEXA) 40 MG tablet Take 1 tablet (40 mg total) by mouth daily. 90 tablet 1   No current facility-administered medications on file prior to visit.     BP 120/80 (BP Location: Left Arm, Cuff Size: Large)   Pulse (!) 52   Temp 98.7 F (37.1 C) (Oral)   Resp 18   Ht 5' 8.5" (1.74 m)   Wt 198 lb 6.4 oz (90 kg)   SpO2 100%   BMI 29.73 kg/m       Objective:   Physical Exam  Constitutional: He is oriented to person, place, and time. He appears well-developed and well-nourished. No distress.  HENT:  Head: Normocephalic and atraumatic.  Cardiovascular: Normal rate and regular rhythm.  No murmur heard. Pulmonary/Chest: Effort normal and breath sounds normal. No respiratory distress. He has no wheezes. He has no rales.  Musculoskeletal: He exhibits no edema.  Neurological: He is alert and oriented to person, place, and time.  Skin: Skin is warm and dry.  Psychiatric: He  has a normal mood and affect. His behavior is normal. Thought content normal.          Assessment & Plan:  Anxiety/depression-this is well controlled on citalopram.  Weight gain- also with some fatigue.  Will check a TSH and a CBC.  If these are within normal limits then will need continued consider tapering off of citalopram.  Would consider Effexor in its place.  I discussed this with the patient.  We will let him know after we review his lab work.

## 2017-08-05 ENCOUNTER — Encounter: Payer: Self-pay | Admitting: Family

## 2017-08-05 ENCOUNTER — Other Ambulatory Visit: Payer: Self-pay | Admitting: Family

## 2017-08-05 MED ORDER — CITALOPRAM HYDROBROMIDE 20 MG PO TABS: ORAL_TABLET | ORAL | 0 refills | Status: DC

## 2017-08-05 MED ORDER — VENLAFAXINE HCL 37.5 MG PO TABS: ORAL_TABLET | ORAL | 2 refills | Status: DC

## 2017-08-09 MED FILL — CITALOPRAM HBR 20 MG TABLET: 20 | 28 days supply | Qty: 30 | Fill #0

## 2017-08-09 MED FILL — VENLAFAXINE HCL 37.5 MG TAB: 37.5 | 34 days supply | Qty: 60 | Fill #0

## 2017-09-27 MED FILL — VENLAFAXINE HCL 37.5 MG TAB: 37.5 | 30 days supply | Qty: 60 | Fill #1

## 2017-11-15 MED FILL — VENLAFAXINE HCL 37.5 MG TAB: 37.5 | 30 days supply | Qty: 60 | Fill #2

## 2017-12-15 ENCOUNTER — Other Ambulatory Visit: Payer: Self-pay | Admitting: *Deleted

## 2017-12-15 MED ORDER — VENLAFAXINE HCL ER 75 MG PO CP24: 75.0000 mg | ORAL_CAPSULE | Freq: Every day | ORAL | 0 refills | Status: DC

## 2017-12-15 NOTE — Telephone Encounter (Signed)
Attempted to reach pt but unable to get through. Sent [employer].

## 2017-12-15 NOTE — Telephone Encounter (Signed)
rx sent for  xr for 14 day supply. He will need follow up prior to additional refills.

## 2017-12-15 NOTE — Telephone Encounter (Signed)
Received refill request from [facility] for venlafaxine 37.5mg  2 tablets daily. They are requesting rx for  once a day.  Please see pt email from December and advise?

## 2017-12-21 NOTE — Telephone Encounter (Signed)
[name]-- please see below message from [facility] pharmacy and advise?  Mortimer Fries, Wanted to follow up on a patient John Leach (06/06/1981). We got a script earlier this week for Venlafaxine ER but the patient has previously been on the IR formulation. I noted the chart and have confirmed with the patient he is aware he needs an office visit ASAP and he understands it will likely only be a 2 week supply provided

## 2017-12-22 NOTE — Telephone Encounter (Signed)
Notified Ben at [facility]

## 2017-12-22 NOTE — Telephone Encounter (Signed)
Ok to fill ER please.

## 2017-12-23 MED FILL — VENLAFAXINE HCL ER 75 MG CA: 75 | 14 days supply | Qty: 14 | Fill #0

## 2018-01-10 ENCOUNTER — Other Ambulatory Visit: Payer: Self-pay | Admitting: Family

## 2018-01-12 MED ORDER — VENLAFAXINE HCL ER 75 MG PO CP24: 75.0000 mg | ORAL_CAPSULE | Freq: Every day | ORAL | 0 refills | Status: DC

## 2018-01-12 MED FILL — VENLAFAXINE HCL ER 75 MG CA: 75 | 7 days supply | Qty: 7 | Fill #0

## 2018-01-19 ENCOUNTER — Encounter: Payer: Self-pay | Admitting: Family

## 2018-01-19 ENCOUNTER — Ambulatory Visit (INDEPENDENT_AMBULATORY_CARE_PROVIDER_SITE_OTHER): Payer: 59 | Admitting: Family

## 2018-01-19 VITALS — BP 142/85 | HR 79 | Temp 98.6°F | Resp 16 | Ht 69.0 in | Wt 199.8 lb

## 2018-01-19 DIAGNOSIS — F329 Major depressive disorder, single episode, unspecified: Secondary | ICD-10-CM

## 2018-01-19 DIAGNOSIS — R030 Elevated blood-pressure reading, without diagnosis of hypertension: Secondary | ICD-10-CM

## 2018-01-19 DIAGNOSIS — Z0000 Encounter for general adult medical examination without abnormal findings: Secondary | ICD-10-CM

## 2018-01-19 DIAGNOSIS — F32A Depression, unspecified: Secondary | ICD-10-CM

## 2018-01-19 LAB — CBC WITH DIFFERENTIAL/PLATELET
Basophils Absolute: 0 10*3/uL (ref 0.0–0.1)
Basophils Relative: 1.3 % (ref 0.0–3.0)
EOS ABS: 0.1 10*3/uL (ref 0.0–0.7)
Eosinophils Relative: 1.7 % (ref 0.0–5.0)
HCT: 44.6 % (ref 39.0–52.0)
HEMOGLOBIN: 15.2 g/dL (ref 13.0–17.0)
LYMPHS ABS: 1.1 10*3/uL (ref 0.7–4.0)
Lymphocytes Relative: 29.2 % (ref 12.0–46.0)
MCHC: 34 g/dL (ref 30.0–36.0)
MCV: 91 fl (ref 78.0–100.0)
MONO ABS: 0.3 10*3/uL (ref 0.1–1.0)
Monocytes Relative: 8.6 % (ref 3.0–12.0)
NEUTROS ABS: 2.2 10*3/uL (ref 1.4–7.7)
NEUTROS PCT: 59.2 % (ref 43.0–77.0)
PLATELETS: 195 10*3/uL (ref 150.0–400.0)
RBC: 4.9 Mil/uL (ref 4.22–5.81)
RDW: 13.3 % (ref 11.5–15.5)
WBC: 3.7 10*3/uL — AB (ref 4.0–10.5)

## 2018-01-19 LAB — LIPID PANEL
CHOL/HDL RATIO: 4
CHOLESTEROL: 193 mg/dL (ref 0–200)
HDL: 49.5 mg/dL (ref >39.00)
LDL CALC: 129 mg/dL — AB (ref 0–99)
NonHDL: 143.9
TRIGLYCERIDES: 74 mg/dL (ref 0.0–149.0)
VLDL: 14.8 mg/dL (ref 0.0–40.0)

## 2018-01-19 LAB — HEPATIC FUNCTION PANEL
ALT: 26 U/L (ref 0–53)
AST: 22 U/L (ref 0–37)
Albumin: 4.3 g/dL (ref 3.5–5.2)
Alkaline Phosphatase: 61 U/L (ref 39–117)
BILIRUBIN DIRECT: 0.1 mg/dL (ref 0.0–0.3)
BILIRUBIN TOTAL: 0.4 mg/dL (ref 0.2–1.2)
Total Protein: 6.5 g/dL (ref 6.0–8.3)

## 2018-01-19 LAB — BASIC METABOLIC PANEL
BUN: 17 mg/dL (ref 6–23)
CO2: 28 mEq/L (ref 19–32)
CREATININE: 1.04 mg/dL (ref 0.40–1.50)
Calcium: 9.2 mg/dL (ref 8.4–10.5)
Chloride: 103 mEq/L (ref 96–112)
GFR: 84.97 mL/min (ref >60.00)
GLUCOSE: 98 mg/dL (ref 70–99)
Potassium: 4.8 mEq/L (ref 3.5–5.1)
Sodium: 138 mEq/L (ref 135–145)

## 2018-01-19 LAB — URINALYSIS, ROUTINE W REFLEX MICROSCOPIC
Bilirubin Urine: NEGATIVE
HGB URINE DIPSTICK: NEGATIVE
Ketones, ur: NEGATIVE
Leukocytes, UA: NEGATIVE
Nitrite: NEGATIVE
RBC / HPF: NONE SEEN (ref 0–?)
Specific Gravity, Urine: 1.02 (ref 1.000–1.030)
TOTAL PROTEIN, URINE-UPE24: NEGATIVE
Urine Glucose: NEGATIVE
Urobilinogen, UA: 0.2 (ref 0.0–1.0)
pH: 6 (ref 5.0–8.0)

## 2018-01-19 LAB — TSH: TSH: 3.37 u[IU]/mL (ref 0.35–4.50)

## 2018-01-19 MED ORDER — VENLAFAXINE HCL ER 150 MG PO CP24: 150.0000 mg | ORAL_CAPSULE | Freq: Every day | ORAL | 1 refills | Status: DC

## 2018-01-19 MED FILL — VENLAFAXINE HCL ER 150 MG C: 150 | 30 days supply | Qty: 30 | Fill #0

## 2018-01-19 NOTE — Patient Instructions (Signed)
Please complete lab work prior to leaving. Work on healthy low sodium diet, weight loss and continue exercise.  Increase effexor to 150mg  once daily.

## 2018-01-19 NOTE — Progress Notes (Signed)
Subjective:    Patient ID: John Leach, male    DOB: 10/06/1980, 37 y.o.   MRN: [id number]  HPI  Patient presents today for complete physical.  Immunizations: tetanus 2027 Diet: reports diet is healthy Exercise: biking Vision: due Dental: up to date  Depression- reports that his motivation is "lowered."  Denies anxiety symptoms. Sleeping well.   Wt Readings from Last 3 Encounters:  01/19/18 199 lb 12.8 oz (90.6 kg)  08/04/17 198 lb 6.4 oz (90 kg)  02/02/17 179 lb (81.2 kg)    Review of Systems  Constitutional: Negative for unexpected weight change.  HENT: Negative for hearing loss and rhinorrhea.   Eyes: Negative for visual disturbance.  Respiratory: Negative for cough.   Cardiovascular: Negative for leg swelling.  Gastrointestinal: Negative for blood in stool, constipation and diarrhea.  Genitourinary: Negative for dysuria, frequency and hematuria.  Musculoskeletal: Negative for arthralgias and myalgias.  Skin: Negative for rash.  Neurological: Negative for headaches.  Hematological: Negative for adenopathy.  Psychiatric/Behavioral:       See HPI   Past Medical History:  Diagnosis Date  . Depression with anxiety   . GERD (gastroesophageal reflux disease)      Social History   Socioeconomic History  . Marital status: Married    Spouse name: Not on file  . Number of children: Not on file  . Years of education: Not on file  . Highest education level: Not on file  Occupational History  . Not on file  Social Needs  . Financial resource strain: Not on file  . Food insecurity:    Worry: Not on file    Inability: Not on file  . Transportation needs:    Medical: Not on file    Non-medical: Not on file  Tobacco Use  . Smoking status: Former Smoker    Last attempt to quit: 10/07/2007    Years since quitting: 10.2  . Smokeless tobacco: Never Used  Substance and Sexual Activity  . Alcohol use: Yes    Comment: 10-12, drinks more on weekend  . Drug use: No   . Sexual activity: Yes  Lifestyle  . Physical activity:    Days per week: Not on file    Minutes per session: Not on file  . Stress: Not on file  Relationships  . Social connections:    Talks on phone: Not on file    Gets together: Not on file    Attends religious service: Not on file    Active member of club or organization: Not on file    Attends meetings of clubs or organizations: Not on file    Relationship status: Not on file  . Intimate partner violence:    Fear of current or ex partner: Not on file    Emotionally abused: Not on file    Physically abused: Not on file    Forced sexual activity: Not on file  Other Topics Concern  . Not on file  Social History Narrative   Married   Works for a Naval architect, [employer] operator   2 girls   Completed some college   Enjoys running    History reviewed. No pertinent surgical history.  Family History  Problem Relation Age of Onset  . Heart attack Father   . Heart disease Father 50       MI  . Osteoarthritis Mother   . CAD Maternal Grandfather   . Cancer Maternal Grandfather   . Colon cancer Paternal Grandfather  died at [id number]    No Known Allergies  No current outpatient medications on file prior to visit.   No current facility-administered medications on file prior to visit.     BP (!) 142/85 (BP Location: Right Arm, Cuff Size: Large)   Pulse 79   Temp 98.6 F (37 C) (Oral)   Resp 16   Ht 5\' 9"  (1.753 m)   Wt 199 lb 12.8 oz (90.6 kg)   SpO2 100%   BMI 29.51 kg/m       Objective:   Physical Exam  Physical Exam  Constitutional: He is oriented to person, place, and time. He appears well-developed and well-nourished. No distress.  HENT:  Head: Normocephalic and atraumatic.  Right Ear: Tympanic membrane and ear canal normal.  Left Ear: Tympanic membrane and ear canal normal.  Mouth/Throat: Oropharynx is clear and moist.  Eyes: Pupils are equal, round, and reactive to light. No scleral icterus.  Neck:  Normal range of motion. No thyromegaly present.  Cardiovascular: Normal rate and regular rhythm.   No murmur heard. Pulmonary/Chest: Effort normal and breath sounds normal. No respiratory distress. He has no wheezes. He has no rales. He exhibits no tenderness.  Abdominal: Soft. Bowel sounds are normal. He exhibits no distension and no mass. There is no tenderness. There is no rebound and no guarding.  Musculoskeletal: He exhibits no edema.  Lymphadenopathy:    He has no cervical adenopathy.  Neurological: He is alert and oriented to person, place, and time. He has normal patellar reflexes. He exhibits normal muscle tone. Coordination normal.  Skin: Skin is warm and dry.  Psychiatric: He has a normal mood and affect. His behavior is normal. Judgment and thought content normal.           Assessment & Plan:  Preventative care- discussed diet/exercise/weight loss. Immunizations reviewed and up to date. Obtain follow up lab work.  Depression/anxiety- anxiety is well controlled but depression is not optimized. Will increase effexor from 75mg  to 150mg  once daily.  Elevated blood pressure reading- new. Discussed low sodium diet, exercise, weight loss. Repeat bp in 1 month.      Assessment & Plan:

## 2018-02-18 ENCOUNTER — Ambulatory Visit (INDEPENDENT_AMBULATORY_CARE_PROVIDER_SITE_OTHER): Payer: 59 | Admitting: Family

## 2018-02-18 ENCOUNTER — Encounter: Payer: Self-pay | Admitting: Family

## 2018-02-18 VITALS — BP 140/88 | HR 82 | Temp 98.3°F | Resp 16 | Ht 69.0 in | Wt 200.0 lb

## 2018-02-18 DIAGNOSIS — R030 Elevated blood-pressure reading, without diagnosis of hypertension: Secondary | ICD-10-CM | POA: Diagnosis not present

## 2018-02-18 DIAGNOSIS — R0681 Apnea, not elsewhere classified: Secondary | ICD-10-CM | POA: Diagnosis not present

## 2018-02-18 DIAGNOSIS — R5383 Other fatigue: Secondary | ICD-10-CM | POA: Diagnosis not present

## 2018-02-18 NOTE — Patient Instructions (Signed)
Please complete lab work prior to leaving. You should be contacted about scheduling your sleep study.

## 2018-02-18 NOTE — Progress Notes (Signed)
Subjective:    Patient ID: Beverley FiedlerBryan A Dambrosio, male    DOB: [birth year], [age] y.o.   MRN: [id number]  HPI  Depression- last visit we increased effexor.  He reports that his mood is good.  He still is having trouble motivating.  Has some fatigue.  Not sure that he really has noticed any change since increasing the dose of Effexor. Wt Readings from Last 3 Encounters:  02/18/18 200 lb (90.7 kg)  01/19/18 199 lb 12.8 oz (90.6 kg)  08/04/17 198 lb 6.4 oz (90 kg)     HTN- BP Readings from Last 3 Encounters:  02/18/18 140/88  01/19/18 (!) 142/85  08/04/17 120/80    + snoring,  Some witnessed apnea according to patient's wife.  Reports that he has to drink caffeine in order to feel alert.  Wife is concerned that he may have sleep apnea. Review of Systems    see HPI Past Medical History:  Diagnosis Date  . Depression with anxiety   . GERD (gastroesophageal reflux disease)      Social History   Socioeconomic History  . Marital status: Married    Spouse name: Not on file  . Number of children: Not on file  . Years of education: Not on file  . Highest education level: Not on file  Occupational History  . Not on file  Social Needs  . Financial resource strain: Not on file  . Food insecurity:    Worry: Not on file    Inability: Not on file  . Transportation needs:    Medical: Not on file    Non-medical: Not on file  Tobacco Use  . Smoking status: Former Smoker    Last attempt to quit: 10/07/2007    Years since quitting: 10.3  . Smokeless tobacco: Never Used  Substance and Sexual Activity  . Alcohol use: Yes    Comment: 10-12, drinks more on weekend  . Drug use: No  . Sexual activity: Yes  Lifestyle  . Physical activity:    Days per week: Not on file    Minutes per session: Not on file  . Stress: Not on file  Relationships  . Social connections:    Talks on phone: Not on file    Gets together: Not on file    Attends religious service: Not on file    Active member of  club or organization: Not on file    Attends meetings of clubs or organizations: Not on file    Relationship status: Not on file  . Intimate partner violence:    Fear of current or ex partner: Not on file    Emotionally abused: Not on file    Physically abused: Not on file    Forced sexual activity: Not on file  Other Topics Concern  . Not on file  Social History Narrative   Married   Works for a Naval architectascar team, [employer] operator   2 girls   Completed some college   Enjoys running    No past surgical history on file.  Family History  Problem Relation Age of Onset  . Heart attack Father   . Heart disease Father 350       MI  . Osteoarthritis Mother   . CAD Maternal Grandfather   . Cancer Maternal Grandfather   . Colon cancer Paternal Grandfather        died at 1772    No Known Allergies  Current Outpatient Medications on File Prior to Visit  Medication Sig Dispense Refill  . venlafaxine XR (EFFEXOR-XR) 150 MG 24 hr capsule Take 1 capsule (150 mg total) by mouth daily with breakfast. 30 capsule 1   No current facility-administered medications on file prior to visit.     BP 140/88 (BP Location: Right Arm, Patient Position: Sitting, Cuff Size: Large)   Pulse 82   Temp 98.3 F (36.8 C) (Oral)   Resp 16   Ht 5\' 9"  (1.753 m)   Wt 200 lb (90.7 kg)   SpO2 98%   BMI 29.53 kg/m    Objective:   Physical Exam  Constitutional: He is oriented to person, place, and time. He appears well-developed and well-nourished. No distress.  HENT:  Head: Normocephalic and atraumatic.  Cardiovascular: Normal rate and regular rhythm.  No murmur heard. Pulmonary/Chest: Effort normal and breath sounds normal. No respiratory distress. He has no wheezes. He has no rales.  Musculoskeletal: He exhibits no edema.  Neurological: He is alert and oriented to person, place, and time.  Skin: Skin is warm and dry.  Psychiatric: He has a normal mood and affect. His behavior is normal. Thought content  normal.          Assessment & Plan:  Depression-fair control, I am concerned that perhaps he may actually have sleep apnea which is causing his energy to be low.  Continue effexor for now.  Witness apneas- Will order home sleep study.  I will also order a testosterone level.  If he does not fact have sleep apnea we could initiate CPAP therapy and consider weaning him back on his antidepressant.  I am not convinced at this point that his energy issues are related to depression.  Elevated blood pressure-blood pressure remains borderline.  We will hold off on initiating treatment at this time while we work-up possible sleep apnea.  If he does have untreated sleep apnea this could be contributing to his hypertension.

## 2018-02-21 LAB — TESTOSTERONE TOTAL,FREE,BIO, MALES
ALBUMIN MSPROF: 4.5 g/dL (ref 3.6–5.1)
Sex Hormone Binding: 23 nmol/L (ref 10–50)
Testosterone, Bioavailable: 173.2 ng/dL (ref >=110.0)
Testosterone, Free: 84.2 pg/mL (ref 46.0–224.0)
Testosterone: 473 ng/dL (ref 250–827)

## 2018-02-24 MED FILL — VENLAFAXINE HCL ER 150 MG C: 150 | 30 days supply | Qty: 30 | Fill #1

## 2018-03-22 DIAGNOSIS — G4733 Obstructive sleep apnea (adult) (pediatric): Secondary | ICD-10-CM | POA: Diagnosis not present

## 2018-03-23 DIAGNOSIS — G4733 Obstructive sleep apnea (adult) (pediatric): Secondary | ICD-10-CM | POA: Diagnosis not present

## 2018-03-25 ENCOUNTER — Other Ambulatory Visit: Payer: Self-pay | Admitting: *Deleted

## 2018-03-25 ENCOUNTER — Other Ambulatory Visit: Payer: Self-pay

## 2018-03-25 DIAGNOSIS — R400 Somnolence: Secondary | ICD-10-CM

## 2018-04-01 ENCOUNTER — Telehealth: Payer: Self-pay | Admitting: Family

## 2018-04-01 DIAGNOSIS — G4733 Obstructive sleep apnea (adult) (pediatric): Secondary | ICD-10-CM

## 2018-04-01 NOTE — Telephone Encounter (Signed)
Please let pt know that his sleep study shows moderate OSA.  I would recommend a cpap titration study in the sleep lab.

## 2018-04-04 NOTE — Telephone Encounter (Signed)
Left detailed message on voicemail and to call if not contacted to schedule titration study within 1 week or if he has any questions.

## 2018-05-05 ENCOUNTER — Other Ambulatory Visit: Payer: Self-pay | Admitting: Family

## 2018-05-05 ENCOUNTER — Encounter (HOSPITAL_BASED_OUTPATIENT_CLINIC_OR_DEPARTMENT_OTHER): Payer: 59

## 2018-05-06 MED FILL — VENLAFAXINE HCL ER 150 MG C: 150 | 90 days supply | Qty: 90 | Fill #0

## 2018-05-27 ENCOUNTER — Ambulatory Visit: Payer: 59 | Admitting: Family

## 2018-08-01 ENCOUNTER — Telehealth: Payer: 59 | Admitting: Family

## 2018-08-01 DIAGNOSIS — J019 Acute sinusitis, unspecified: Secondary | ICD-10-CM | POA: Diagnosis not present

## 2018-08-01 MED ORDER — AMOXICILLIN-POT CLAVULANATE 875-125 MG PO TABS: 1.0000 | ORAL_TABLET | Freq: Two times a day (BID) | ORAL | 0 refills | Status: DC

## 2018-08-01 NOTE — Progress Notes (Signed)

## 2018-08-02 MED FILL — AMOX-CLAV 875-125 MG TABLET: 875-125 | 7 days supply | Qty: 14 | Fill #0

## 2018-08-05 ENCOUNTER — Other Ambulatory Visit: Payer: Self-pay | Admitting: Family

## 2018-08-08 MED FILL — VENLAFAXINE HCL ER 150 MG C: 150 | 90 days supply | Qty: 90 | Fill #0

## 2018-10-31 ENCOUNTER — Other Ambulatory Visit: Payer: Self-pay | Admitting: Family

## 2018-11-26 ENCOUNTER — Encounter: Payer: Self-pay | Admitting: Family

## 2018-11-26 DIAGNOSIS — Z3009 Encounter for other general counseling and advice on contraception: Secondary | ICD-10-CM

## 2018-12-16 ENCOUNTER — Telehealth (INDEPENDENT_AMBULATORY_CARE_PROVIDER_SITE_OTHER): Payer: No Typology Code available for payment source | Admitting: Family

## 2018-12-16 ENCOUNTER — Other Ambulatory Visit: Payer: Self-pay

## 2018-12-16 DIAGNOSIS — F419 Anxiety disorder, unspecified: Secondary | ICD-10-CM

## 2018-12-16 DIAGNOSIS — F329 Major depressive disorder, single episode, unspecified: Secondary | ICD-10-CM | POA: Diagnosis not present

## 2018-12-16 DIAGNOSIS — F32A Depression, unspecified: Secondary | ICD-10-CM

## 2018-12-16 DIAGNOSIS — G4733 Obstructive sleep apnea (adult) (pediatric): Secondary | ICD-10-CM | POA: Diagnosis not present

## 2018-12-16 MED ORDER — VENLAFAXINE HCL ER 150 MG PO CP24: 150.0000 mg | ORAL_CAPSULE | Freq: Every day | ORAL | 1 refills | Status: DC

## 2018-12-16 MED FILL — VENLAFAXINE HCL ER 150 MG C: 150 | 90 days supply | Qty: 90 | Fill #0

## 2018-12-16 NOTE — Progress Notes (Signed)
Virtual Visit via Video Note  I connected with John Leach on 12/16/18 at  7:40 AM EDT by a video enabled telemedicine application and verified that I am speaking with the correct person using two identifiers.  Location: Patient: home Provider: office   I discussed the limitations of evaluation and management by telemedicine and the availability of in person appointments. The patient expressed understanding and agreed to proceed.  History of Present Illness:  OSA- had home sleep study 03/22/18. Did not schedule cpap titration due to high deductible insurance last year but he is now on a better insurance plan and would like to proceed.    Depression/anxiety- reports that he continues effexor. Reports that mood is stable and anxiety is stable on current dose. Reports that he has cut way back on his drinking and has lost 12 pounds.    Observations/Objective:  Gen: Awake, alert, no acute distress Resp: Breathing is even and non-labored Psych: calm/pleasant demeanor Neuro: Alert and Oriented x 3, + facial symmetry, speech is clear.   Assessment and Plan:  Anxiety/depression-stable on effexor. Continue same.  OSA- will reorder cpap titration. Plan to initiate home cpap once this is completed.  Commended him on his weight loss and cutting back on his alcohol consumption.    Follow Up Instructions:    I discussed the assessment and treatment plan with the patient. The patient was provided an opportunity to ask questions and all were answered. The patient agreed with the plan and demonstrated an understanding of the instructions.   The patient was advised to call back or seek an in-person evaluation if the symptoms worsen or if the condition fails to improve as anticipated.   Lemont Fillers, NP

## 2019-01-16 MED FILL — DIAZEPAM 10 MG TABS: 10 | 1 days supply | Qty: 1 | Fill #0

## 2019-03-31 MED FILL — VENLAFAXINE HCL ER 150 MG C: 150 | 90 days supply | Qty: 90 | Fill #1

## 2019-07-26 ENCOUNTER — Other Ambulatory Visit: Payer: Self-pay | Admitting: Family

## 2019-07-27 MED FILL — VENLAFAXINE HCL ER 150 MG C: 150 | 30 days supply | Qty: 30 | Fill #0

## 2019-09-11 ENCOUNTER — Other Ambulatory Visit: Payer: Self-pay | Admitting: Family

## 2019-09-12 MED FILL — VENLAFAXINE HCL ER 150 MG C: 150 | 30 days supply | Qty: 30 | Fill #0

## 2019-09-13 ENCOUNTER — Ambulatory Visit: Payer: 59 | Admitting: Family

## 2019-09-13 ENCOUNTER — Encounter: Payer: Self-pay | Admitting: Family

## 2019-09-13 ENCOUNTER — Other Ambulatory Visit: Payer: Self-pay

## 2019-09-13 VITALS — BP 124/90 | HR 102 | Temp 95.6°F | Resp 16 | Ht 69.0 in | Wt 218.0 lb

## 2019-09-13 DIAGNOSIS — Z0000 Encounter for general adult medical examination without abnormal findings: Secondary | ICD-10-CM | POA: Diagnosis not present

## 2019-09-13 DIAGNOSIS — F418 Other specified anxiety disorders: Secondary | ICD-10-CM | POA: Diagnosis not present

## 2019-09-13 LAB — BASIC METABOLIC PANEL
BUN: 25 mg/dL — ABNORMAL HIGH (ref 6–23)
CO2: 25 mEq/L (ref 19–32)
Calcium: 9.2 mg/dL (ref 8.4–10.5)
Chloride: 105 mEq/L (ref 96–112)
Creatinine, Ser: 1.04 mg/dL (ref 0.40–1.50)
GFR: 79.26 mL/min (ref >60.00)
Glucose, Bld: 89 mg/dL (ref 70–99)
Potassium: 4.4 mEq/L (ref 3.5–5.1)
Sodium: 138 mEq/L (ref 135–145)

## 2019-09-13 LAB — CBC WITH DIFFERENTIAL/PLATELET
Basophils Absolute: 0.1 10*3/uL (ref 0.0–0.1)
Basophils Relative: 1.4 % (ref 0.0–3.0)
Eosinophils Absolute: 0.1 10*3/uL (ref 0.0–0.7)
Eosinophils Relative: 2.2 % (ref 0.0–5.0)
HCT: 45.1 % (ref 39.0–52.0)
Hemoglobin: 15.2 g/dL (ref 13.0–17.0)
Lymphocytes Relative: 31.1 % (ref 12.0–46.0)
Lymphs Abs: 1.5 10*3/uL (ref 0.7–4.0)
MCHC: 33.7 g/dL (ref 30.0–36.0)
MCV: 90.7 fl (ref 78.0–100.0)
Monocytes Absolute: 0.5 10*3/uL (ref 0.1–1.0)
Monocytes Relative: 9.8 % (ref 3.0–12.0)
Neutro Abs: 2.6 10*3/uL (ref 1.4–7.7)
Neutrophils Relative %: 55.5 % (ref 43.0–77.0)
Platelets: 197 10*3/uL (ref 150.0–400.0)
RBC: 4.98 Mil/uL (ref 4.22–5.81)
RDW: 13.1 % (ref 11.5–15.5)
WBC: 4.7 10*3/uL (ref 4.0–10.5)

## 2019-09-13 LAB — HEPATIC FUNCTION PANEL
ALT: 45 U/L (ref 0–53)
AST: 28 U/L (ref 0–37)
Albumin: 4.6 g/dL (ref 3.5–5.2)
Alkaline Phosphatase: 67 U/L (ref 39–117)
Bilirubin, Direct: 0.1 mg/dL (ref 0.0–0.3)
Total Bilirubin: 0.5 mg/dL (ref 0.2–1.2)
Total Protein: 6.9 g/dL (ref 6.0–8.3)

## 2019-09-13 LAB — LIPID PANEL
Cholesterol: 244 mg/dL — ABNORMAL HIGH (ref 0–200)
HDL: 54.8 mg/dL (ref >39.00)
LDL Cholesterol: 155 mg/dL — ABNORMAL HIGH (ref 0–99)
NonHDL: 188.99
Total CHOL/HDL Ratio: 4
Triglycerides: 172 mg/dL — ABNORMAL HIGH (ref 0.0–149.0)
VLDL: 34.4 mg/dL (ref 0.0–40.0)

## 2019-09-13 LAB — TSH: TSH: 2.75 u[IU]/mL (ref 0.35–4.50)

## 2019-09-13 MED ORDER — VENLAFAXINE HCL ER 150 MG PO CP24: ORAL_CAPSULE | ORAL | 1 refills | Status: DC

## 2019-09-13 NOTE — Progress Notes (Signed)
Subjective:    Patient ID: John Leach, male    DOB: 02/03/1981, 39 y.o.   MRN: [id number]  HPI   Patient is a 39 yr old male who presents today for cpx.  Immunizations: tetanus 2017, had flu shot from work.  Diet:  Not eating healthy, eating too much Exercise: some walking Vision: due Dental:  Wt Readings from Last 3 Encounters:  09/13/19 218 lb (98.9 kg)  02/18/18 200 lb (90.7 kg)  01/19/18 199 lb 12.8 oz (90.6 kg)     Depression/anxiety- currently maintained on effexor xr 150mg .  Reports that mood and anxiety are well controlled. Denies any significant side effects from the medication, though notes that he feels a little funny in head if he is delayed in taking his scheduled dose.     Review of Systems  Constitutional: Positive for unexpected weight change.  HENT: Negative for hearing loss and rhinorrhea.   Eyes: Negative for visual disturbance.  Respiratory: Negative for cough and shortness of breath.   Cardiovascular: Negative for chest pain.  Gastrointestinal: Negative for constipation and diarrhea.  Genitourinary: Negative for dysuria, frequency and hematuria.  Musculoskeletal: Negative for arthralgias and myalgias.  Skin: Negative for rash.  Neurological: Negative for headaches.  Hematological: Negative for adenopathy.  Psychiatric/Behavioral:       See HPI     Past Medical History:  Diagnosis Date  . Depression with anxiety   . GERD (gastroesophageal reflux disease)      Social History   Socioeconomic History  . Marital status: Married    Spouse name: Not on file  . Number of children: Not on file  . Years of education: Not on file  . Highest education level: Not on file  Occupational History  . Not on file  Tobacco Use  . Smoking status: Former Smoker    Quit date: 10/07/2007    Years since quitting: 11.9  . Smokeless tobacco: Never Used  Substance and Sexual Activity  . Alcohol use: Yes    Comment: 10-12, drinks more on weekend  . Drug  use: No  . Sexual activity: Yes  Other Topics Concern  . 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 Difficulty of Paying Living Expenses: Not on file  Food Insecurity:   . Worried About Corporate investment banker in the Last Year: Not on file  . Ran Out of Food in the Last Year: Not on file  Transportation Needs:   . Lack of Transportation (Medical): Not on file  . Lack of Transportation (Non-Medical): Not on file  Physical Activity:   . Days of Exercise per Week: Not on file  . Minutes of Exercise per Session: Not on file  Stress:   . Feeling of Stress : Not on file  Social Connections:   . Frequency of Communication with Friends and Family: Not on file  . Frequency of Social Gatherings with Friends and Family: Not on file  . Attends Religious Services: Not on file  . Active Member of Clubs or Organizations: Not on file  . Attends Programme researcher, broadcasting/film/video Meetings: Not on file  . Marital Status: Not on file  Intimate Partner Violence:   . Fear of Current or Ex-Partner: Not on file  . Emotionally Abused: Not on  file  . Physically Abused: Not on file  . Sexually Abused: Not on file    No past surgical history on file.  Family History  Problem Relation Age of Onset  . Heart attack Father   . Heart disease Father 40       MI  . Osteoarthritis Mother   . CAD Maternal Grandfather   . Cancer Maternal Grandfather   . Colon cancer Paternal Grandfather        died at 67    No Known Allergies  Current Outpatient Medications on File Prior to Visit  Medication Sig Dispense Refill  . venlafaxine XR (EFFEXOR-XR) 150 MG 24 hr capsule TAKE 1 CAPSULE BY MOUTH DAILY WITH BREAKFAST 30 capsule 0   No current facility-administered medications on file prior to visit.    BP 124/90 (BP Location: Left Arm, Patient Position:  Sitting, Cuff Size: Large)   Pulse (!) 102   Temp (!) 95.6 F (35.3 C) (Temporal)   Resp 16   Ht 5\' 9"  (1.753 m)   Wt 218 lb (98.9 kg)   SpO2 98%   BMI 32.19 kg/m       Objective:   Physical Exam  Physical Exam  Constitutional: He is oriented to person, place, and time. He appears well-developed and well-nourished. No distress.  HENT:  Head: Normocephalic and atraumatic.  Right Ear: Tympanic membrane and ear canal normal.  Left Ear: Tympanic membrane and ear canal normal.  Mouth/Throat:  Not examined Eyes: Pupils are equal, round, and reactive to light. No scleral icterus.  Neck: Normal range of motion. No thyromegaly present.  Cardiovascular: Normal rate and regular rhythm.   No murmur heard. Pulmonary/Chest: Effort normal and breath sounds normal. No respiratory distress. He has no wheezes. He has no rales. He exhibits no tenderness.  Abdominal: Soft. Bowel sounds are normal. He exhibits no distension and no mass. There is no tenderness. There is no rebound and no guarding.  Musculoskeletal: He exhibits no edema.  Lymphadenopathy:    He has no cervical adenopathy.  Neurological: He is alert and oriented to person, place, and time. He has normal patellar reflexes. He exhibits normal muscle tone. Coordination normal.  Skin: Skin is warm and dry.  Psychiatric: He has a normal mood and affect. His behavior is normal. Judgment and thought content normal.           Assessment & Plan:   Preventative care- discussed healthy diet, exercise, weight loss.  Immunizations reviewed and up to date.  Will obtain routine lab work. Recommended that he schedule routine vision/dental exams.   Depression/anxiety- stable on effexor, continue same. Sounds like he has mild withdrawal symptoms if he delays his dosing. Reinforced importance of taking medication on time.  This visit occurred during the SARS-CoV-2 public health emergency.  Safety protocols were in place, including screening  questions prior to the visit, additional usage of staff PPE, and extensive cleaning of exam room while observing appropriate contact time as indicated for disinfecting solutions.        Assessment & Plan:

## 2019-09-13 NOTE — Patient Instructions (Addendum)
Please schedule routine vision and dental exams.  Continue to work on [employer], exercise and weight loss.

## 2019-09-14 ENCOUNTER — Encounter: Payer: Self-pay | Admitting: Family

## 2019-10-13 MED FILL — VENLAFAXINE HCL ER 150 MG C: 150 | 90 days supply | Qty: 90 | Fill #0

## 2019-12-11 ENCOUNTER — Telehealth: Payer: Self-pay | Admitting: Family

## 2019-12-11 DIAGNOSIS — G4733 Obstructive sleep apnea (adult) (pediatric): Secondary | ICD-10-CM

## 2019-12-11 NOTE — Telephone Encounter (Signed)
Spoke with patient regarding OSA and referral. Patient states he has made lifestyle changes and is currently no longer having issues. Declines referral at this time.

## 2019-12-11 NOTE — Telephone Encounter (Signed)
Please contact pt and let him know that I would like to refer him to a sleep specialist for ongoing management of his sleep apnea. I have pended the referral.

## 2020-01-29 MED FILL — VENLAFAXINE HCL ER 150 MG C: 150 | 90 days supply | Qty: 90 | Fill #1

## 2020-03-12 ENCOUNTER — Encounter: Payer: Self-pay | Admitting: Family

## 2020-03-12 ENCOUNTER — Other Ambulatory Visit: Payer: Self-pay

## 2020-03-12 ENCOUNTER — Ambulatory Visit: Payer: 59 | Admitting: Family

## 2020-03-12 VITALS — BP 132/92 | HR 70 | Temp 98.9°F | Resp 16 | Ht 69.0 in | Wt 182.0 lb

## 2020-03-12 DIAGNOSIS — F418 Other specified anxiety disorders: Secondary | ICD-10-CM

## 2020-03-12 DIAGNOSIS — G4733 Obstructive sleep apnea (adult) (pediatric): Secondary | ICD-10-CM

## 2020-03-12 DIAGNOSIS — R030 Elevated blood-pressure reading, without diagnosis of hypertension: Secondary | ICD-10-CM | POA: Diagnosis not present

## 2020-03-12 NOTE — Patient Instructions (Signed)
Great job with exercise and weight loss! Try to limit the sodium in your diet to 2000mg /day

## 2020-03-12 NOTE — Progress Notes (Signed)
Subjective:    Patient ID: John Leach, male    DOB: 08-Apr-1981, 39 y.o.   MRN: [id number]  HPI  Patient is a 39 yr old male who presents today for follow up.    Anxiety/Depression- continues effexor. Reports mood is good.   OSA-He has stopped snoring.  Stopped drinking beer, started counting calories.  Wakes up feeling rested.   Wt Readings from Last 3 Encounters:  03/12/20 182 lb (82.6 kg)  09/13/19 218 lb (98.9 kg)  02/18/18 200 lb (90.7 kg)   Doing cardio, light weigh training.    BP Readings from Last 3 Encounters:  03/12/20 (!) 132/92  09/13/19 124/90  02/18/18 140/88   Review of Systems   See HPI  Past Medical History:  Diagnosis Date  . Depression with anxiety   . Depression with anxiety   . GERD (gastroesophageal reflux disease)      Social History   Socioeconomic History  . Marital status: Married    Spouse name: Not on file  . Number of children: Not on file  . Years of education: Not on file  . Highest education level: Not on file  Occupational History  . Not on file  Tobacco Use  . Smoking status: Former Smoker    Quit date: 10/07/2007    Years since quitting: 12.4  . Smokeless tobacco: Never Used  Substance and Sexual Activity  . Alcohol use: Yes    Comment: 10-12, drinks more on weekend  . Drug use: No  . Sexual activity: Yes  Other Topics Concern  . Not on file  Social History Narrative   Married   Works for a Naval architect, [employer]   2 girls   Completed some college   Enjoys running   Social Determinants of Corporate investment banker Strain:   . Difficulty of Paying Living Expenses:   Food Insecurity:   . Worried About Programme researcher, broadcasting/film/video in the Last Year:   . Barista in the Last Year:   Transportation Needs:   . Freight forwarder (Medical):   Marland Kitchen Lack of Transportation (Non-Medical):   Physical Activity:   . Days of Exercise per Week:   . Minutes of Exercise per Session:   Stress:   . Feeling of  Stress :   Social Connections:   . Frequency of Communication with Friends and Family:   . Frequency of Social Gatherings with Friends and Family:   . Attends Religious Services:   . Active Member of Clubs or Organizations:   . Attends Banker Meetings:   Marland Kitchen Marital Status:   Intimate Partner Violence:   . Fear of Current or Ex-Partner:   . Emotionally Abused:   Marland Kitchen Physically Abused:   . Sexually Abused:     No past surgical history on file.  Family History  Problem Relation Age of Onset  . Heart attack Father   . Heart disease Father 66       MI  . Osteoarthritis Mother   . CAD Maternal Grandfather   . Cancer Maternal Grandfather   . Colon cancer Paternal Grandfather        died at 52    No Known Allergies  Current Outpatient Medications on File Prior to Visit  Medication Sig Dispense Refill  . venlafaxine XR (EFFEXOR-XR) 150 MG 24 hr capsule TAKE 1 CAPSULE BY MOUTH DAILY WITH BREAKFAST 90 capsule 1   No current facility-administered medications on file  prior to visit.    BP (!) 132/92 (BP Location: Right Arm, Patient Position: Sitting, Cuff Size: Small)   Pulse 70   Temp 98.9 F (37.2 C) (Oral)   Resp 16   Ht 5\' 9"  (1.753 m)   Wt 182 lb (82.6 kg)   SpO2 99%   BMI 26.88 kg/m       Objective:   Physical Exam Constitutional:      General: He is not in acute distress.    Appearance: He is well-developed.  HENT:     Head: Normocephalic and atraumatic.  Cardiovascular:     Rate and Rhythm: Normal rate and regular rhythm.     Heart sounds: No murmur heard.   Pulmonary:     Effort: Pulmonary effort is normal. No respiratory distress.     Breath sounds: Normal breath sounds. No wheezing or rales.  Skin:    General: Skin is warm and dry.  Neurological:     Mental Status: He is alert and oriented to person, place, and time.  Psychiatric:        Behavior: Behavior normal.        Thought Content: Thought content normal.            Assessment & Plan:  OSA- recommended follow up sleep study- Hopefully with his weight loss he is no longer having sleep apnea.  Depression/anxiety- stable on current dose of effexor.  Elevated blood pressure reading- manual recheck today was 138/90. Discussed low sodium diet.  Plan to return in 6 weeks for bp check.  This visit occurred during the SARS-CoV-2 public health emergency.  Safety protocols were in place, including screening questions prior to the visit, additional usage of staff PPE, and extensive cleaning of exam room while observing appropriate contact time as indicated for disinfecting solutions.

## 2020-04-23 ENCOUNTER — Ambulatory Visit: Payer: 59 | Admitting: Family

## 2020-04-23 DIAGNOSIS — Z0289 Encounter for other administrative examinations: Secondary | ICD-10-CM

## 2020-05-02 ENCOUNTER — Other Ambulatory Visit: Payer: Self-pay | Admitting: Family

## 2020-05-02 MED FILL — VENLAFAXINE HCL ER 150 MG C: 150 | 90 days supply | Qty: 90 | Fill #0

## 2020-07-31 MED FILL — VENLAFAXINE HCL ER 150 MG C: 150 | 90 days supply | Qty: 90 | Fill #1

## 2020-10-22 ENCOUNTER — Other Ambulatory Visit: Payer: Self-pay | Admitting: Family

## 2020-10-23 ENCOUNTER — Other Ambulatory Visit: Payer: Self-pay | Admitting: Family

## 2020-10-23 MED FILL — VENLAFAXINE HCL ER 150 MG C: 150 | 30 days supply | Qty: 30 | Fill #0

## 2020-11-04 MED FILL — VENLAFAXINE HCL ER 150 MG C: 150 | 30 days supply | Qty: 30 | Fill #0

## 2020-11-18 ENCOUNTER — Other Ambulatory Visit (HOSPITAL_BASED_OUTPATIENT_CLINIC_OR_DEPARTMENT_OTHER): Payer: Self-pay

## 2020-12-03 ENCOUNTER — Other Ambulatory Visit (HOSPITAL_COMMUNITY): Payer: Self-pay

## 2020-12-03 ENCOUNTER — Encounter: Payer: Self-pay | Admitting: Family

## 2020-12-03 ENCOUNTER — Other Ambulatory Visit: Payer: Self-pay

## 2020-12-03 MED ORDER — VENLAFAXINE HCL ER 150 MG PO CP24
150.0000 mg | ORAL_CAPSULE | Freq: Every day | ORAL | 0 refills | Status: DC
  Filled 2020-12-03 – 2020-12-06 (×2): qty 30, 30d supply, fill #0

## 2020-12-06 ENCOUNTER — Other Ambulatory Visit (HOSPITAL_BASED_OUTPATIENT_CLINIC_OR_DEPARTMENT_OTHER): Payer: Self-pay

## 2020-12-18 ENCOUNTER — Other Ambulatory Visit (HOSPITAL_BASED_OUTPATIENT_CLINIC_OR_DEPARTMENT_OTHER): Payer: Self-pay

## 2020-12-20 ENCOUNTER — Encounter: Payer: Self-pay | Admitting: Family

## 2020-12-20 ENCOUNTER — Other Ambulatory Visit: Payer: Self-pay

## 2020-12-20 ENCOUNTER — Ambulatory Visit: Payer: 59 | Admitting: Family

## 2020-12-20 ENCOUNTER — Other Ambulatory Visit (HOSPITAL_BASED_OUTPATIENT_CLINIC_OR_DEPARTMENT_OTHER): Payer: Self-pay

## 2020-12-20 VITALS — BP 162/89 | HR 97 | Temp 99.1°F | Resp 16 | Ht 69.0 in | Wt 205.0 lb

## 2020-12-20 DIAGNOSIS — R635 Abnormal weight gain: Secondary | ICD-10-CM | POA: Diagnosis not present

## 2020-12-20 DIAGNOSIS — F418 Other specified anxiety disorders: Secondary | ICD-10-CM | POA: Diagnosis not present

## 2020-12-20 DIAGNOSIS — I10 Essential (primary) hypertension: Secondary | ICD-10-CM

## 2020-12-20 HISTORY — DX: Essential (primary) hypertension: I10

## 2020-12-20 MED ORDER — AMLODIPINE BESYLATE 5 MG PO TABS
5.0000 mg | ORAL_TABLET | Freq: Every day | ORAL | 3 refills | Status: DC
Start: 2020-12-20 — End: 2021-01-17
  Filled 2020-12-20: qty 30, 30d supply, fill #0

## 2020-12-20 MED ORDER — VENLAFAXINE HCL ER 150 MG PO CP24
150.0000 mg | ORAL_CAPSULE | Freq: Every day | ORAL | 1 refills | Status: DC
  Filled 2020-12-20 – 2021-01-07 (×3): qty 90, 90d supply, fill #0
  Filled 2021-04-03: qty 90, 90d supply, fill #1

## 2020-12-20 NOTE — Patient Instructions (Signed)
Please begin amlodipine 5mg  once daily for blood pressure. Continue effexor.

## 2020-12-20 NOTE — Assessment & Plan Note (Signed)
Stable on effexor 150mg once daily. Continue same  

## 2020-12-20 NOTE — Progress Notes (Signed)
Subjective:   By signing my name below, I, Shehryar Baig, attest that this documentation has been prepared under the direction and in the presence of Sandford Craze NP. 12/20/2020     Patient ID: John Leach, male    DOB: 29-Dec-1980, 40 y.o.   MRN: [id number]  No chief complaint on file.   HPI Patient is in today for a office visit. He is doing well at this time. He is requesting a refill on 150 mg Effexor daily PO. His paternal grandmother has a history of thyroid issues.  Depression/Anxiety- He is doing well at this time while on 150 mg Effexor daily PO.  Blood Pressure- His repeat manual blood pressure is 158/90 during his visit. It is elevated since his last visit.  Diet- He has gained weight since his last visit. States that after he lost weight he transitioned to a 2100 calorie diet for "maintenance" but he has gained weight despite exercising 4-5 days a week.   Exercise- He maintained his exercise level since his last visit.    Past Medical History:  Diagnosis Date  . Depression with anxiety   . Depression with anxiety   . GERD (gastroesophageal reflux disease)     No past surgical history on file.  Family History  Problem Relation Age of Onset  . Heart attack Father   . Heart disease Father 9       MI  . Osteoarthritis Mother   . CAD Maternal Grandfather   . Cancer Maternal Grandfather   . Colon cancer Paternal Grandfather        died at 45    Social History   Socioeconomic History  . Marital status: Married    Spouse name: Not on file  . Number of children: Not on file  . Years of education: Not on file  . Highest education level: Not on file  Occupational History  . Not on file  Tobacco Use  . Smoking status: Former Smoker    Quit date: 10/07/2007    Years since quitting: 13.2  . Smokeless tobacco: Never Used  Substance and Sexual Activity  . Alcohol use: Yes    Comment: 10-12, drinks more on weekend  . Drug use: No  . Sexual activity:  Yes  Other Topics Concern  . Not on file  Social History Narrative   Married   Works for a Naval architect, [employer]   2 girls   Completed some college   Enjoys running   Social Determinants of Corporate investment banker Strain: Not on file  Food Insecurity: Not on file  Transportation Needs: Not on file  Physical Activity: Not on file  Stress: Not on file  Social Connections: Not on file  Intimate Partner Violence: Not on file    Outpatient Medications Prior to Visit  Medication Sig Dispense Refill  . venlafaxine XR (EFFEXOR-XR) 150 MG 24 hr capsule TAKE 1 CAPSULE (150 MG TOTAL) BY MOUTH DAILY WITH BREAKFAST. *PT NEED APPT* 30 capsule 0   No facility-administered medications prior to visit.    No Known Allergies  ROS     Objective:    Physical Exam Constitutional:      General: He is not in acute distress.    Appearance: Normal appearance. He is well-developed.  HENT:     Head: Normocephalic and atraumatic.     Right Ear: External ear normal.     Left Ear: External ear normal.  Eyes:  Extraocular Movements: Extraocular movements intact.     Pupils: Pupils are equal, round, and reactive to light.  Cardiovascular:     Rate and Rhythm: Normal rate and regular rhythm.     Pulses: Normal pulses.     Heart sounds: Normal heart sounds. No murmur heard.   Pulmonary:     Effort: Pulmonary effort is normal. No respiratory distress.     Breath sounds: Normal breath sounds. No stridor. No wheezing, rhonchi or rales.  Skin:    General: Skin is warm and dry.  Neurological:     Mental Status: He is alert and oriented to person, place, and time.  Psychiatric:        Behavior: Behavior normal.        Thought Content: Thought content normal.     There were no vitals taken for this visit. Wt Readings from Last 3 Encounters:  03/12/20 182 lb (82.6 kg)  09/13/19 218 lb (98.9 kg)  02/18/18 200 lb (90.7 kg)    Diabetic Foot Exam - Simple   No data filed     Lab Results  Component Value Date   WBC 4.7 09/13/2019   HGB 15.2 09/13/2019   HCT 45.1 09/13/2019   PLT 197.0 09/13/2019   GLUCOSE 89 09/13/2019   CHOL 244 (H) 09/13/2019   TRIG 172.0 (H) 09/13/2019   HDL 54.80 09/13/2019   LDLCALC 155 (H) 09/13/2019   ALT 45 09/13/2019   AST 28 09/13/2019   NA 138 09/13/2019   K 4.4 09/13/2019   CL 105 09/13/2019   CREATININE 1.04 09/13/2019   BUN 25 (H) 09/13/2019   CO2 25 09/13/2019   TSH 2.75 09/13/2019    Lab Results  Component Value Date   TSH 2.75 09/13/2019   Lab Results  Component Value Date   WBC 4.7 09/13/2019   HGB 15.2 09/13/2019   HCT 45.1 09/13/2019   MCV 90.7 09/13/2019   PLT 197.0 09/13/2019   Lab Results  Component Value Date   NA 138 09/13/2019   K 4.4 09/13/2019   CO2 25 09/13/2019   GLUCOSE 89 09/13/2019   BUN 25 (H) 09/13/2019   CREATININE 1.04 09/13/2019   BILITOT 0.5 09/13/2019   ALKPHOS 67 09/13/2019   AST 28 09/13/2019   ALT 45 09/13/2019   PROT 6.9 09/13/2019   ALBUMIN 4.6 09/13/2019   CALCIUM 9.2 09/13/2019   GFR 79.26 09/13/2019   Lab Results  Component Value Date   CHOL 244 (H) 09/13/2019   Lab Results  Component Value Date   HDL 54.80 09/13/2019   Lab Results  Component Value Date   LDLCALC 155 (H) 09/13/2019   Lab Results  Component Value Date   TRIG 172.0 (H) 09/13/2019   Lab Results  Component Value Date   CHOLHDL 4 09/13/2019   No results found for: HGBA1C     Assessment & Plan:   Problem List Items Addressed This Visit   None      No orders of the defined types were placed in this encounter.   I, Sandford Craze NP, personally preformed the services described in this documentation.  All medical record entries made by the scribe were at my direction and in my presence.  I have reviewed the chart and discharge instructions (if applicable) and agree that the record reflects my personal performance and is accurate and complete. 12/20/2020   I,Shehryar  Baig,acting as a scribe for Lemont Fillers, NP.,have documented all relevant documentation on the behalf  of Lemont Fillers, NP,as directed by  Lemont Fillers, NP while in the presence of Lemont Fillers, NP.   Shehryar [employer]

## 2020-12-20 NOTE — Assessment & Plan Note (Signed)
BP Readings from Last 3 Encounters:  12/20/20 (!) 162/89  03/12/20 (!) 132/92  09/13/19 124/90   New. Weight gain has likely contributed. Will initiate amlodipine 5mg  once daily in addition to lifestyle modifications.

## 2020-12-20 NOTE — Assessment & Plan Note (Signed)
Will check TSH to rule out hypothyroidism. Continue work on [employer], exercise, weight loss.

## 2020-12-21 LAB — TSH: TSH: 2.06 mIU/L (ref 0.40–4.50)

## 2020-12-30 ENCOUNTER — Other Ambulatory Visit (HOSPITAL_BASED_OUTPATIENT_CLINIC_OR_DEPARTMENT_OTHER): Payer: Self-pay

## 2021-01-06 ENCOUNTER — Other Ambulatory Visit (HOSPITAL_BASED_OUTPATIENT_CLINIC_OR_DEPARTMENT_OTHER): Payer: Self-pay

## 2021-01-07 ENCOUNTER — Other Ambulatory Visit (HOSPITAL_BASED_OUTPATIENT_CLINIC_OR_DEPARTMENT_OTHER): Payer: Self-pay

## 2021-01-17 ENCOUNTER — Ambulatory Visit: Payer: 59 | Admitting: Family

## 2021-01-17 ENCOUNTER — Encounter: Payer: Self-pay | Admitting: Family

## 2021-01-17 ENCOUNTER — Other Ambulatory Visit (HOSPITAL_COMMUNITY): Payer: Self-pay

## 2021-01-17 ENCOUNTER — Other Ambulatory Visit (HOSPITAL_BASED_OUTPATIENT_CLINIC_OR_DEPARTMENT_OTHER): Payer: Self-pay

## 2021-01-17 ENCOUNTER — Other Ambulatory Visit: Payer: Self-pay

## 2021-01-17 DIAGNOSIS — I10 Essential (primary) hypertension: Secondary | ICD-10-CM | POA: Diagnosis not present

## 2021-01-17 MED ORDER — AMLODIPINE BESYLATE 10 MG PO TABS
10.0000 mg | ORAL_TABLET | Freq: Every day | ORAL | 0 refills | Status: DC
  Filled 2021-01-17: qty 90, 90d supply, fill #0

## 2021-01-17 MED ORDER — AMLODIPINE BESYLATE 10 MG PO TABS
10.0000 mg | ORAL_TABLET | Freq: Every day | ORAL | 0 refills | Status: DC
Start: 2021-01-17 — End: 2021-01-17
  Filled 2021-01-17: qty 90, 90d supply, fill #0

## 2021-01-17 NOTE — Progress Notes (Signed)
Subjective:   By signing my name below, I, Shehryar Baig, attest that this documentation has been prepared under the direction and in the presence of Sandford Craze NP. 01/17/2021      Patient ID: John Leach, male    DOB: June 29, 1981, 40 y.o.   MRN: [id number]  No chief complaint on file.   HPI Patient is in today for a office visit.   Blood pressure- His blood pressure is elevated today. He continues taking 5 mg amlodipine daily PO. He denies any side effects from amlodipine, headaches, and leg swelling at this time. BP Readings from Last 3 Encounters:  01/17/21 (!) 149/99  12/20/20 (!) 162/89  03/12/20 (!) 132/92    Health Maintenance Due  Topic Date Due  . HIV Screening  Never done  . Hepatitis C Screening  Never done    Past Medical History:  Diagnosis Date  . Depression with anxiety   . Depression with anxiety   . GERD (gastroesophageal reflux disease)   . Hypertension 12/20/2020    No past surgical history on file.  Family History  Problem Relation Age of Onset  . Heart attack Father   . Heart disease Father 75       MI  . Osteoarthritis Mother   . CAD Maternal Grandfather   . Cancer Maternal Grandfather   . Colon cancer Paternal Grandfather        died at 29    Social History   Socioeconomic History  . Marital status: Married    Spouse name: Not on file  . Number of children: Not on file  . Years of education: Not on file  . Highest education level: Not on file  Occupational History  . Not on file  Tobacco Use  . Smoking status: Former Smoker    Quit date: 10/07/2007    Years since quitting: 13.2  . Smokeless tobacco: Never Used  Substance and Sexual Activity  . Alcohol use: Yes    Comment: 10-12, drinks more on weekend  . Drug use: No  . Sexual activity: Yes  Other Topics Concern  . Not on file  Social History Narrative   Married   Works for a Naval architect, [employer]   2 girls   Completed some college   Enjoys running    Social Determinants of Corporate investment banker Strain: Not on file  Food Insecurity: Not on file  Transportation Needs: Not on file  Physical Activity: Not on file  Stress: Not on file  Social Connections: Not on file  Intimate Partner Violence: Not on file    Outpatient Medications Prior to Visit  Medication Sig Dispense Refill  . amLODipine (NORVASC) 5 MG tablet Take 1 tablet (5 mg total) by mouth daily. 30 tablet 3  . venlafaxine XR (EFFEXOR-XR) 150 MG 24 hr capsule Take 1 capsule (150 mg total) by mouth daily with breakfast. 90 capsule 1   No facility-administered medications prior to visit.    No Known Allergies  Review of Systems  Cardiovascular: Negative for leg swelling.  Neurological: Negative for headaches.       Objective:    Physical Exam Constitutional:      General: He is not in acute distress.    Appearance: Normal appearance. He is not ill-appearing.  HENT:     Head: Normocephalic and atraumatic.     Right Ear: External ear normal.     Left Ear: External ear normal.  Eyes:  Extraocular Movements: Extraocular movements intact.     Pupils: Pupils are equal, round, and reactive to light.  Cardiovascular:     Rate and Rhythm: Normal rate and regular rhythm.     Pulses: Normal pulses.     Heart sounds: Normal heart sounds. No murmur heard. No gallop.   Pulmonary:     Effort: Pulmonary effort is normal. No respiratory distress.     Breath sounds: Normal breath sounds. No wheezing, rhonchi or rales.  Skin:    General: Skin is warm and dry.  Neurological:     Mental Status: He is alert and oriented to person, place, and time.  Psychiatric:        Behavior: Behavior normal.     There were no vitals taken for this visit. Wt Readings from Last 3 Encounters:  12/20/20 205 lb (93 kg)  03/12/20 182 lb (82.6 kg)  09/13/19 218 lb (98.9 kg)       Assessment & Plan:   Problem List Items Addressed This Visit   None      No orders of the  defined types were placed in this encounter.   I, Sandford Craze NP, personally preformed the services described in this documentation.  All medical record entries made by the scribe were at my direction and in my presence.  I have reviewed the chart and discharge instructions (if applicable) and agree that the record reflects my personal performance and is accurate and complete. 01/17/2021   I,Shehryar Baig,acting as a Neurosurgeon for Lemont Fillers, NP.,have documented all relevant documentation on the behalf of Lemont Fillers, NP,as directed by  Lemont Fillers, NP while in the presence of Lemont Fillers, NP.   Shehryar [employer]

## 2021-01-17 NOTE — Assessment & Plan Note (Signed)
BP Readings from Last 3 Encounters:  01/17/21 (!) 149/99  12/20/20 (!) 162/89  03/12/20 (!) 132/92   BP is improving but still above goal. Will increase amlodipine from 5mg  to 10mg .

## 2021-01-17 NOTE — Patient Instructions (Signed)
Please increase amlodipine to 5mg once daily.  ?

## 2021-02-14 ENCOUNTER — Ambulatory Visit: Payer: 59 | Admitting: Family

## 2021-02-21 ENCOUNTER — Ambulatory Visit: Payer: 59 | Admitting: Family

## 2021-02-21 ENCOUNTER — Other Ambulatory Visit (HOSPITAL_BASED_OUTPATIENT_CLINIC_OR_DEPARTMENT_OTHER): Payer: Self-pay

## 2021-02-21 ENCOUNTER — Other Ambulatory Visit: Payer: Self-pay

## 2021-02-21 DIAGNOSIS — I10 Essential (primary) hypertension: Secondary | ICD-10-CM | POA: Diagnosis not present

## 2021-02-21 DIAGNOSIS — F418 Other specified anxiety disorders: Secondary | ICD-10-CM | POA: Diagnosis not present

## 2021-02-21 MED ORDER — LISINOPRIL 10 MG PO TABS
10.0000 mg | ORAL_TABLET | Freq: Every day | ORAL | 3 refills | Status: DC
  Filled 2021-02-21: qty 30, 30d supply, fill #0
  Filled 2021-04-03: qty 30, 30d supply, fill #1
  Filled 2021-04-04: qty 90, 90d supply, fill #1

## 2021-02-21 MED ORDER — AMLODIPINE BESYLATE 10 MG PO TABS
10.0000 mg | ORAL_TABLET | Freq: Every day | ORAL | 0 refills | Status: DC
  Filled 2021-02-21 – 2021-04-03 (×2): qty 90, 90d supply, fill #0

## 2021-02-21 NOTE — Patient Instructions (Signed)
Please add lisinopril 10mg  once daily for blood pressure.

## 2021-02-21 NOTE — Progress Notes (Signed)
Subjective:   By signing my name below, I, Shehryar Baig, attest that this documentation has been prepared under the direction and in the presence of Sandford Craze NP. 02/21/2021      Patient ID: John Leach, male    DOB: 1981/01/09, 40 y.o.   MRN: [id number]  No chief complaint on file.   HPI Patient is in today for a office visit.   Hypertension- He switched his blood pressure medication from 5 mg amlodipine to 10 mg amlodipine daily PO during his last visit and reports doing well. His blood pressure has slightly decreased since taking it. He measures his blood pressure at home. BP Readings from Last 3 Encounters:  02/21/21 (!) 146/84  01/17/21 (!) 149/99  12/20/20 (!) 162/89   Depression/Anxiety- He reports that he is managing his anxiety well at this time. Mood is stable. He continues Careers information officer.   Health Maintenance Due  Topic Date Due   HIV Screening  Never done   Hepatitis C Screening  Never done    Past Medical History:  Diagnosis Date   Depression with anxiety    Depression with anxiety    GERD (gastroesophageal reflux disease)    Hypertension 12/20/2020    No past surgical history on file.  Family History  Problem Relation Age of Onset   Heart attack Father    Heart disease Father 46       MI   Osteoarthritis Mother    CAD Maternal Grandfather    Cancer Maternal Grandfather    Colon cancer Paternal Grandfather        died at 80    Social History   Socioeconomic History   Marital status: Married    Spouse name: Not on file   Number of children: Not on file   Years of education: Not on file   Highest education level: Not on file  Occupational History   Not on file  Tobacco Use   Smoking status: Former    Pack years: 0.00    Types: Cigarettes    Quit date: 10/07/2007    Years since quitting: 13.3   Smokeless tobacco: Never  Substance and Sexual Activity   Alcohol use: Yes    Comment: 10-12, drinks more on weekend   Drug use: No    Sexual activity: Yes  Other Topics Concern   Not on file  Social History Narrative   Married   Works for a Naval architect, Public house manager   2 girls   Completed some college   Enjoys running   Social Determinants of Corporate investment banker Strain: Not on file  Food Insecurity: Not on file  Transportation Needs: Not on file  Physical Activity: Not on file  Stress: Not on file  Social Connections: Not on file  Intimate Partner Violence: Not on file    Outpatient Medications Prior to Visit  Medication Sig Dispense Refill   venlafaxine XR (EFFEXOR-XR) 150 MG 24 hr capsule Take 1 capsule (150 mg total) by mouth daily with breakfast. 90 capsule 1   amLODipine (NORVASC) 10 MG tablet Take 1 tablet (10 mg total) by mouth daily. 90 tablet 0   No facility-administered medications prior to visit.    No Known Allergies  ROS     Objective:    Physical Exam Constitutional:      General: He is not in acute distress.    Appearance: Normal appearance. He is not ill-appearing.  HENT:     Head: Normocephalic  and atraumatic.     Right Ear: External ear normal.     Left Ear: External ear normal.  Eyes:     Extraocular Movements: Extraocular movements intact.     Pupils: Pupils are equal, round, and reactive to light.  Cardiovascular:     Rate and Rhythm: Normal rate and regular rhythm.     Pulses: Normal pulses.     Heart sounds: Normal heart sounds. No murmur heard.   No gallop.  Pulmonary:     Effort: Pulmonary effort is normal. No respiratory distress.     Breath sounds: Normal breath sounds. No wheezing, rhonchi or rales.  Skin:    General: Skin is warm and dry.  Neurological:     Mental Status: He is alert and oriented to person, place, and time.  Psychiatric:        Behavior: Behavior normal.    BP (!) 146/84 (BP Location: Right Arm, Patient Position: Sitting, Cuff Size: Small)   Pulse 99   Temp 99 F (37.2 C) (Oral)   Resp 16   Wt 203 lb (92.1 kg)   SpO2 98%   BMI  29.98 kg/m  Wt Readings from Last 3 Encounters:  02/21/21 203 lb (92.1 kg)  01/17/21 200 lb 12.8 oz (91.1 kg)  12/20/20 205 lb (93 kg)       Assessment & Plan:   Problem List Items Addressed This Visit       Unprioritized   Hypertension    BP Readings from Last 3 Encounters:  02/21/21 (!) 146/84  01/17/21 (!) 149/99  12/20/20 (!) 162/89  Still above goal. Continue amlodipin 10mg , add lisinopril 10 mg.        Relevant Medications   lisinopril (ZESTRIL) 10 MG tablet   amLODipine (NORVASC) 10 MG tablet   Depression with anxiety    Stable on effexor xr 150mg  once daily. Continue same.          Meds ordered this encounter  Medications   lisinopril (ZESTRIL) 10 MG tablet    Sig: Take 1 tablet (10 mg total) by mouth daily.    Dispense:  30 tablet    Refill:  3    Order Specific Question:   Supervising Provider    Answer:   [id number]]   amLODipine (NORVASC) 10 MG tablet    Sig: Take 1 tablet (10 mg total) by mouth daily.    Dispense:  90 tablet    Refill:  0    Order Specific Question:   Supervising Provider    Answer:   [id number] [[id number]]    I, Danise Edge NP, personally preformed the services described in this documentation.  All medical record entries made by the scribe were at my direction and in my presence.  I have reviewed the chart and discharge instructions (if applicable) and agree that the record reflects my personal performance and is accurate and complete. 02/21/2021   I,Shehryar Baig,acting as a scribe for Sandford Craze, NP.,have documented all relevant documentation on the behalf of 04/24/2021, NP,as directed by  Lemont Fillers, NP while in the presence of Lemont Fillers, NP.   Lemont Fillers, NP

## 2021-02-21 NOTE — Assessment & Plan Note (Addendum)
BP Readings from Last 3 Encounters:  02/21/21 (!) 146/84  01/17/21 (!) 149/99  12/20/20 (!) 162/89   Still above goal. Continue amlodipin 10mg , add lisinopril 10 mg.

## 2021-02-21 NOTE — Assessment & Plan Note (Signed)
Stable on effexor xr 150mg  once daily. Continue same.

## 2021-02-28 ENCOUNTER — Other Ambulatory Visit (HOSPITAL_BASED_OUTPATIENT_CLINIC_OR_DEPARTMENT_OTHER): Payer: Self-pay

## 2021-03-21 ENCOUNTER — Ambulatory Visit: Payer: 59 | Admitting: Family

## 2021-03-24 ENCOUNTER — Other Ambulatory Visit: Payer: Self-pay

## 2021-03-24 ENCOUNTER — Emergency Department: Admit: 2021-03-24 | Discharge status: Absent without leave | Payer: 59 | Source: Home / Self Care

## 2021-03-24 ENCOUNTER — Emergency Department (INDEPENDENT_AMBULATORY_CARE_PROVIDER_SITE_OTHER): Payer: 59

## 2021-03-24 ENCOUNTER — Emergency Department (INDEPENDENT_AMBULATORY_CARE_PROVIDER_SITE_OTHER)
Admission: EM | Admit: 2021-03-24 | Discharge: 2021-03-24 | Disposition: A | Discharge status: Home / Self Care | Payer: 59 | Source: Home / Self Care

## 2021-03-24 DIAGNOSIS — S8392XA Sprain of unspecified site of left knee, initial encounter: Secondary | ICD-10-CM | POA: Diagnosis not present

## 2021-03-24 DIAGNOSIS — M25562 Pain in left knee: Secondary | ICD-10-CM | POA: Diagnosis not present

## 2021-03-24 NOTE — ED Provider Notes (Signed)
[facility]    CSN: [id number] Arrival date & time: 03/24/21  1750      History   Chief Complaint Chief Complaint  Patient presents with   Knee Pain    left    HPI John Leach is a 40 y.o. male.   HPI patient presents with left knee pain for 1 day, ports hurting meal while at gym working out yesterday.  Patient reports hearing popping sound of left knee while working out on [employer].  Past Medical History:  Diagnosis Date   Depression with anxiety    Depression with anxiety    GERD (gastroesophageal reflux disease)    Hypertension 12/20/2020    Patient Active Problem List   Diagnosis Date Noted   Weight gain 12/20/2020   Hypertension 12/20/2020   Depression with anxiety 02/02/2017   Family history of heart disease in male family member before age 32 10/06/2016   Former smoker 10/06/2016   GERD (gastroesophageal reflux disease) 06/07/2009    History reviewed. No pertinent surgical history.     Home Medications    Prior to Admission medications   Medication Sig Start Date End Date Taking? Authorizing Provider  amLODipine (NORVASC) 10 MG tablet Take 1 tablet (10 mg total) by mouth daily. 02/21/21   Sandford Craze, NP  lisinopril (ZESTRIL) 10 MG tablet Take 1 tablet (10 mg total) by mouth daily. 02/21/21   Sandford Craze, NP  venlafaxine XR (EFFEXOR-XR) 150 MG 24 hr capsule Take 1 capsule (150 mg total) by mouth daily with breakfast. 12/20/20   Sandford Craze, NP    Family History Family History  Problem Relation Age of Onset   Heart attack Father    Heart disease Father 42       MI   Osteoarthritis Mother    CAD Maternal Grandfather    Cancer Maternal Grandfather    Colon cancer Paternal Grandfather        died at 76    Social History Social History   Tobacco Use   Smoking status: Former    Types: Cigarettes    Quit date: 10/07/2007    Years since quitting: 13.4   Smokeless tobacco: Never  Vaping Use   Vaping  Use: Every day   Substances: Nicotine  Substance Use Topics   Alcohol use: Yes    Comment: 10-12, drinks more on weekend   Drug use: No     Allergies   Patient has no known allergies.   Review of Systems Review of Systems  Musculoskeletal:        Left knee pain x 1 day    Physical Exam Triage Vital Signs ED Triage Vitals  Enc Vitals Group     BP 03/24/21 1830 (!) 155/109     Pulse Rate 03/24/21 1830 64     Resp 03/24/21 1830 15     Temp 03/24/21 1830 98.4 F (36.9 C)     Temp Source 03/24/21 1830 Oral     SpO2 03/24/21 1830 98 %     Weight 03/24/21 1826 200 lb (90.7 kg)     Height 03/24/21 1826 5\' 9"  (1.753 m)     Head Circumference --      Peak Flow --      Pain Score 03/24/21 1826 6     Pain Loc --      Pain Edu? --      Excl. in GC? --    No data found.  Updated Vital Signs BP 05/24/21)  155/109 (BP Location: Left Arm)   Pulse 64   Temp 98.4 F (36.9 C) (Oral)   Resp 15   Ht 5\' 9"  (1.753 m)   Wt 200 lb (90.7 kg)   SpO2 98%   BMI 29.53 kg/m    Physical Exam Vitals and nursing note reviewed.  Constitutional:      Appearance: Normal appearance. He is normal weight.  HENT:     Head: Normocephalic and atraumatic.     Mouth/Throat:     Mouth: Mucous membranes are moist.     Pharynx: Oropharynx is clear.  Eyes:     Extraocular Movements: Extraocular movements intact.     Conjunctiva/sclera: Conjunctivae normal.     Pupils: Pupils are equal, round, and reactive to light.  Cardiovascular:     Rate and Rhythm: Normal rate and regular rhythm.     Pulses: Normal pulses.     Heart sounds: Normal heart sounds.  Pulmonary:     Effort: Pulmonary effort is normal.     Breath sounds: Rhonchi present. No wheezing or rales.  Musculoskeletal:        General: Swelling and tenderness present. No deformity. Normal range of motion.     Cervical back: Normal range of motion and neck supple. No tenderness.     Comments: Left knee (anterior aspect): TTP over superior  medial  aspect of patella with mild soft tissue swelling noted, negative Lachman's, negative anterior drawer  Lymphadenopathy:     Cervical: No cervical adenopathy.  Skin:    General: Skin is warm and dry.  Neurological:     General: No focal deficit present.     Mental Status: He is alert and oriented to person, place, and time. Mental status is at baseline.     UC Treatments / Results  Labs (all labs ordered are listed, but only abnormal results are displayed) Labs Reviewed - No data to display  EKG   Radiology DG Knee Complete 4 Views Left  Result Date: 03/24/2021 CLINICAL DATA:  Left knee pain after twisting injury 1 day ago EXAM: LEFT KNEE - COMPLETE 4+ VIEW COMPARISON:  None. FINDINGS: No evidence of fracture, dislocation, or joint effusion. No evidence of arthropathy or other focal bone abnormality. Soft tissues are unremarkable. IMPRESSION: Negative. Electronically Signed   By: 05/24/2021 D.O.   On: 03/24/2021 18:59    Procedures Procedures (including critical care time)  Medications Ordered in UC Medications - No data to display  Initial Impression / Assessment and Plan / UC Course  I have reviewed the triage vital signs and the nursing notes.  Pertinent labs & imaging results that were available during my care of the patient were reviewed by me and considered in my medical decision making (see chart for details).     MDM 1.  Acute pain of left knee-Advised patient may take Ibuprofen 800 mg 1-2 times daily, as needed for the next 7 to 10 days.  Advised/encouraged patient to RICE left knee for 20 to 25 minutes 2-3 times daily for the next 3 days.  Advised/encouraged patient to avoid activities involving left knee for the next 7-10 days.  Patient discharged home, hemodynamically stable. Final Clinical Impressions(s) / UC Diagnoses   Final diagnoses:  Acute pain of left knee     Discharge Instructions      Advised patient may take ibuprofen 800 mg 1-2 times  daily, as needed for the next 7 to 10 days.  Advised/encourage patient to RICE left knee  for 20 to 25 minutes 2-3 times daily for the next 3 days.  Advised/encouraged patient to avoid activities involving left knee for the next 7 to 10 days.     ED Prescriptions   None    PDMP not reviewed this encounter.   Trevor Iha, FNP 03/24/21 2052

## 2021-03-24 NOTE — ED Triage Notes (Signed)
Pt st he hurt his L knee while at the gym yesterday. Pt st he was getting off the elliptical when he st he felt something pop in his L knee. Pt st he iced it last night and he felt better this morning but as the day progressed it got worse.

## 2021-03-24 NOTE — Discharge Instructions (Addendum)
Advised patient may take ibuprofen 800 mg 1-2 times daily, as needed for the next 7 to 10 days.  Advised/encourage patient to RICE left knee for 20 to 25 minutes 2-3 times daily for the next 3 days.  Advised/encouraged patient to avoid activities involving left knee for the next 7 to 10 days.

## 2021-03-28 ENCOUNTER — Ambulatory Visit: Payer: 59 | Admitting: Family

## 2021-03-28 ENCOUNTER — Other Ambulatory Visit: Payer: Self-pay

## 2021-03-28 VITALS — BP 134/86 | HR 91 | Temp 98.6°F | Resp 16 | Wt 204.0 lb

## 2021-03-28 DIAGNOSIS — M25562 Pain in left knee: Secondary | ICD-10-CM | POA: Insufficient documentation

## 2021-03-28 DIAGNOSIS — I10 Essential (primary) hypertension: Secondary | ICD-10-CM

## 2021-03-28 NOTE — Assessment & Plan Note (Signed)
Initial DBP was elevated, but repeat DBP was better. Continue amlodipine 10mg  once daily.

## 2021-03-28 NOTE — Patient Instructions (Signed)
Please continue ice, ibuprofen as needed and call if your symptoms do not continue to improve.

## 2021-03-28 NOTE — Assessment & Plan Note (Signed)
?   MCL strain versus mild meniscal injury? He is noting significant improvement with ice/Ibupofen. Will continue same. He is advised to let me know if his symptoms do not continue to improve over the next 2-3 weeks. Would plan sports med referral at that time.

## 2021-03-28 NOTE — Progress Notes (Signed)
Subjective:   By signing my name below, I, Shehryar Baig, attest that this documentation has been prepared under the direction and in the presence of Sandford Craze NP. 03/28/2021    Patient ID: John Leach, male    DOB: 1980/09/01, 40 y.o.   MRN: [id number]  Chief Complaint  Patient presents with   Knee Pain    Left knee pain, was seen at urgent care (notes on epic)    HPI Patient is in today for a office visit.  Knee pain- He complains of left anterior knee pain for a week and a half. He reports while getting of the elliptical machine he twisted his knee and injured it. He then went home and iced it then went to urgent care to get a x-ray and found he might have a MCL tear.    Health Maintenance Due  Topic Date Due   HIV Screening  Never done   Hepatitis C Screening  Never done   INFLUENZA VACCINE  03/17/2021    Past Medical History:  Diagnosis Date   Depression with anxiety    Depression with anxiety    GERD (gastroesophageal reflux disease)    Hypertension 12/20/2020    No past surgical history on file.  Family History  Problem Relation Age of Onset   Heart attack Father    Heart disease Father 68       MI   Osteoarthritis Mother    CAD Maternal Grandfather    Cancer Maternal Grandfather    Colon cancer Paternal Grandfather        died at 64    Social History   Socioeconomic History   Marital status: Married    Spouse name: Not on file   Number of children: Not on file   Years of education: Not on file   Highest education level: Not on file  Occupational History   Not on file  Tobacco Use   Smoking status: Former    Types: Cigarettes    Quit date: 10/07/2007    Years since quitting: 13.4   Smokeless tobacco: Never  Vaping Use   Vaping Use: Every day   Substances: Nicotine  Substance and Sexual Activity   Alcohol use: Yes    Comment: 10-12, drinks more on weekend   Drug use: No   Sexual activity: Yes  Other Topics Concern   Not on  file  Social History Narrative   Married   Works for a Naval architect, [employer]   2 girls   Completed some college   Enjoys running   Social Determinants of Corporate investment banker Strain: Not on file  Food Insecurity: Not on file  Transportation Needs: Not on file  Physical Activity: Not on file  Stress: Not on file  Social Connections: Not on file  Intimate Partner Violence: Not on file    Outpatient Medications Prior to Visit  Medication Sig Dispense Refill   amLODipine (NORVASC) 10 MG tablet Take 1 tablet (10 mg total) by mouth daily. 90 tablet 0   lisinopril (ZESTRIL) 10 MG tablet Take 1 tablet (10 mg total) by mouth daily. 30 tablet 3   venlafaxine XR (EFFEXOR-XR) 150 MG 24 hr capsule Take 1 capsule (150 mg total) by mouth daily with breakfast. 90 capsule 1   No facility-administered medications prior to visit.    No Known Allergies  Review of Systems  Musculoskeletal:  Positive for joint pain (left knee pain).  Objective:    Physical Exam Constitutional:      General: He is not in acute distress.    Appearance: Normal appearance. He is not ill-appearing.  HENT:     Head: Normocephalic and atraumatic.     Right Ear: External ear normal.     Left Ear: External ear normal.  Eyes:     Extraocular Movements: Extraocular movements intact.     Pupils: Pupils are equal, round, and reactive to light.  Pulmonary:     Effort: Pulmonary effort is normal.  Musculoskeletal:     Right knee: No swelling.     Left knee: Swelling present. Tenderness present.     Comments: Tenderness beneath the left patella medially with mild swelling No left knee instability noted on exam.  Skin:    General: Skin is warm and dry.  Neurological:     Mental Status: He is alert and oriented to person, place, and time.  Psychiatric:        Behavior: Behavior normal.    BP 134/86   Pulse 91   Temp 98.6 F (37 C) (Oral)   Resp 16   Wt 204 lb (92.5 kg)   SpO2 98%   BMI  30.13 kg/m  Wt Readings from Last 3 Encounters:  03/28/21 204 lb (92.5 kg)  03/24/21 200 lb (90.7 kg)  02/21/21 203 lb (92.1 kg)       Assessment & Plan:   Problem List Items Addressed This Visit       Unprioritized   Hypertension    Initial DBP was elevated, but repeat DBP was better. Continue amlodipine 10mg  once daily.       Acute pain of left knee - Primary    ? MCL strain versus mild meniscal injury? He is noting significant improvement with ice/Ibupofen. Will continue same. He is advised to let me know if his symptoms do not continue to improve over the next 2-3 weeks. Would plan sports med referral at that time.         No orders of the defined types were placed in this encounter.   I, NP, personally preformed the services described in this documentation.  All medical record entries made by the scribe were at my direction and in my presence.  I have reviewed the chart and discharge instructions (if applicable) and agree that the record reflects my personal performance and is accurate and complete. 03/28/2021   I,Shehryar Baig,acting as a 05/28/2021 for Neurosurgeon, NP.,have documented all relevant documentation on the behalf of Lemont Fillers, NP,as directed by  Lemont Fillers, NP while in the presence of Lemont Fillers, NP.   Lemont Fillers, NP

## 2021-04-04 ENCOUNTER — Other Ambulatory Visit (HOSPITAL_BASED_OUTPATIENT_CLINIC_OR_DEPARTMENT_OTHER): Payer: Self-pay

## 2021-06-06 ENCOUNTER — Other Ambulatory Visit (HOSPITAL_BASED_OUTPATIENT_CLINIC_OR_DEPARTMENT_OTHER): Payer: Self-pay

## 2021-06-25 ENCOUNTER — Other Ambulatory Visit: Payer: Self-pay

## 2021-07-16 ENCOUNTER — Other Ambulatory Visit: Payer: Self-pay | Admitting: Family

## 2021-07-17 ENCOUNTER — Other Ambulatory Visit (HOSPITAL_BASED_OUTPATIENT_CLINIC_OR_DEPARTMENT_OTHER): Payer: Self-pay

## 2021-07-17 MED ORDER — LISINOPRIL 10 MG PO TABS
10.0000 mg | ORAL_TABLET | Freq: Every day | ORAL | 3 refills | Status: DC
  Filled 2021-07-17: qty 30, 30d supply, fill #0
  Filled 2021-08-18: qty 30, 30d supply, fill #1
  Filled 2021-09-09: qty 30, 30d supply, fill #2
  Filled 2021-10-26: qty 30, 30d supply, fill #3

## 2021-07-17 MED ORDER — VENLAFAXINE HCL ER 150 MG PO CP24
150.0000 mg | ORAL_CAPSULE | Freq: Every day | ORAL | 1 refills | Status: DC
  Filled 2021-07-17: qty 90, 90d supply, fill #0
  Filled 2021-09-09 – 2021-09-16 (×2): qty 90, 90d supply, fill #1

## 2021-07-17 MED ORDER — AMLODIPINE BESYLATE 10 MG PO TABS
10.0000 mg | ORAL_TABLET | Freq: Every day | ORAL | 0 refills | Status: DC
  Filled 2021-07-17: qty 90, 90d supply, fill #0

## 2021-08-19 ENCOUNTER — Other Ambulatory Visit (HOSPITAL_BASED_OUTPATIENT_CLINIC_OR_DEPARTMENT_OTHER): Payer: Self-pay

## 2021-09-09 ENCOUNTER — Other Ambulatory Visit (HOSPITAL_BASED_OUTPATIENT_CLINIC_OR_DEPARTMENT_OTHER): Payer: Self-pay

## 2021-09-09 ENCOUNTER — Other Ambulatory Visit: Payer: Self-pay | Admitting: Family

## 2021-09-09 MED ORDER — AMLODIPINE BESYLATE 10 MG PO TABS
10.0000 mg | ORAL_TABLET | Freq: Every day | ORAL | 0 refills | Status: DC
  Filled 2021-09-09 – 2021-09-16 (×2): qty 30, 30d supply, fill #0

## 2021-09-16 ENCOUNTER — Other Ambulatory Visit (HOSPITAL_BASED_OUTPATIENT_CLINIC_OR_DEPARTMENT_OTHER): Payer: Self-pay

## 2021-09-16 IMAGING — DX DG KNEE COMPLETE 4+V*L*
5 series · 5 of 5 positions shown · non-contrast
Comparison: None.

CLINICAL DATA: Left knee pain after twisting injury 1 day ago

EXAM:
LEFT KNEE - COMPLETE 4+ VIEW

[knee ap]
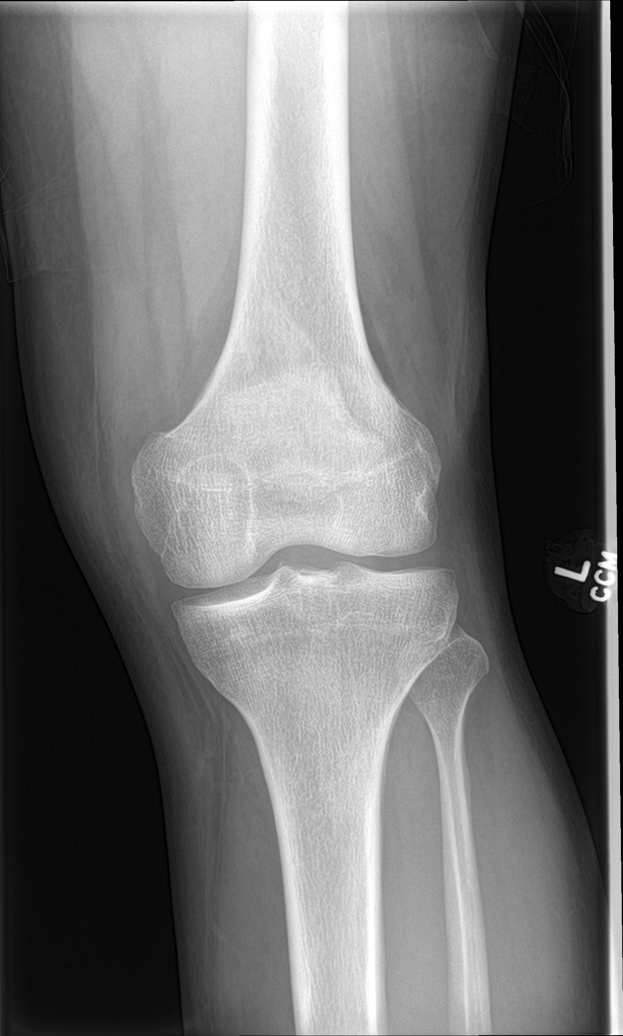

[knee lat (1 of 2)]
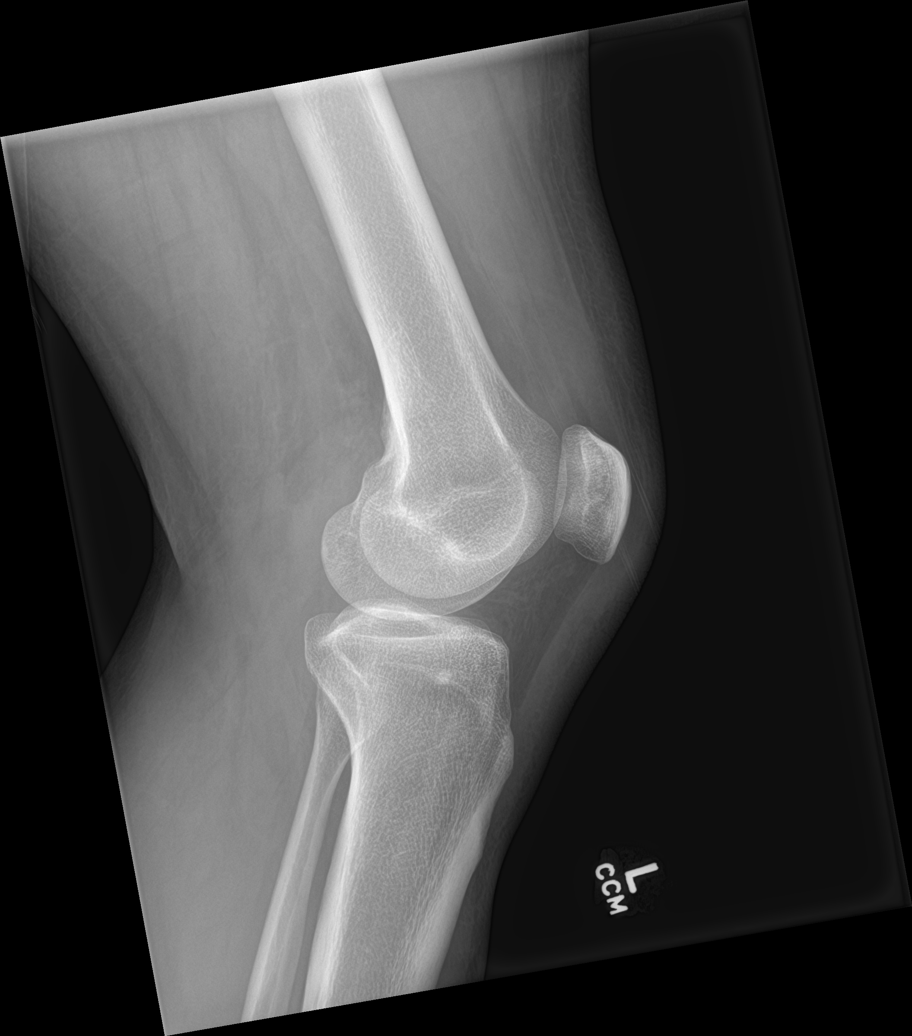

[knee obl (1 of 2)]
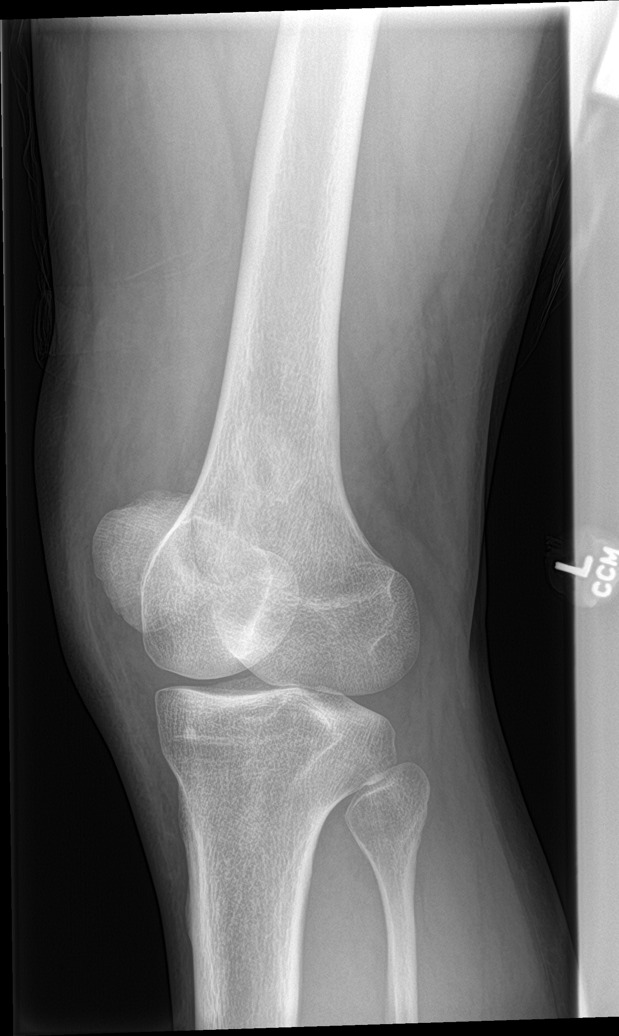

[knee obl (2 of 2)]
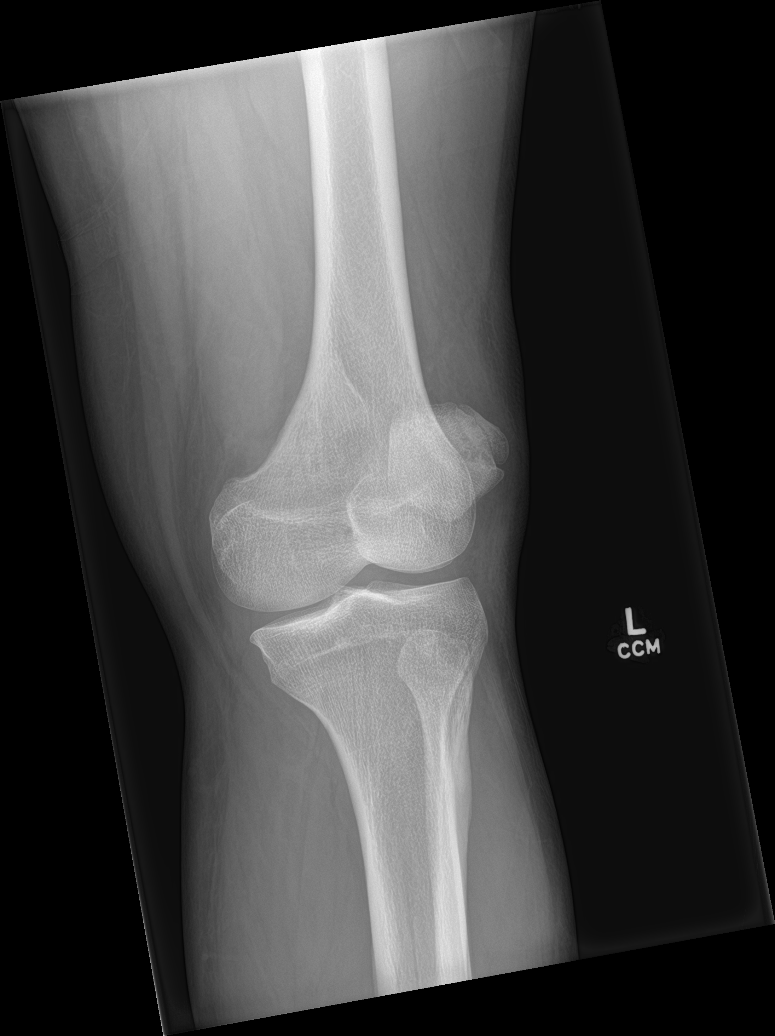

[knee lat (2 of 2)]
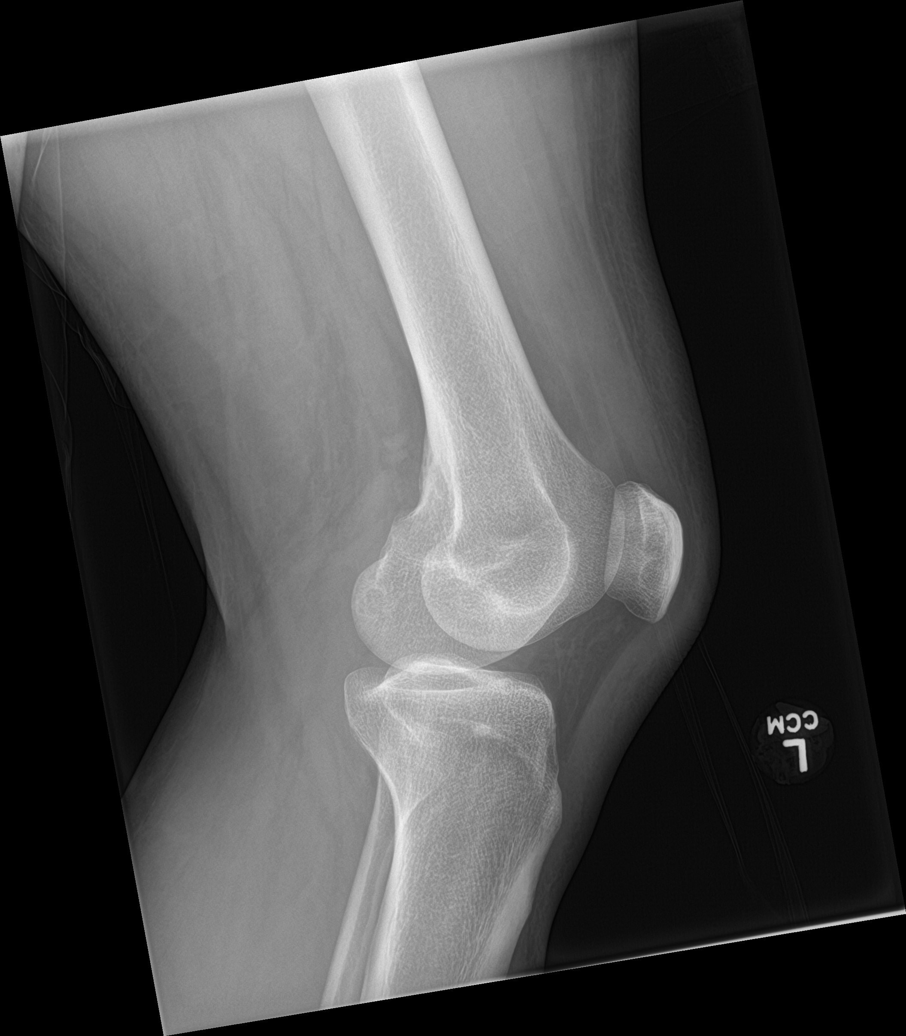

[5 of 5 positions shown; findings below may reference images not displayed]

FINDINGS: No evidence of fracture, dislocation, or joint effusion. No evidence
of arthropathy or other focal bone abnormality. Soft tissues are
unremarkable.
IMPRESSION: Negative.

## 2021-10-27 ENCOUNTER — Other Ambulatory Visit (HOSPITAL_BASED_OUTPATIENT_CLINIC_OR_DEPARTMENT_OTHER): Payer: Self-pay

## 2021-11-29 ENCOUNTER — Other Ambulatory Visit: Payer: Self-pay | Admitting: Family

## 2021-12-01 ENCOUNTER — Other Ambulatory Visit (HOSPITAL_BASED_OUTPATIENT_CLINIC_OR_DEPARTMENT_OTHER): Payer: Self-pay

## 2021-12-01 MED ORDER — AMLODIPINE BESYLATE 10 MG PO TABS
10.0000 mg | ORAL_TABLET | Freq: Every day | ORAL | 0 refills | Status: DC
  Filled 2021-12-01: qty 30, 30d supply, fill #0

## 2021-12-01 MED ORDER — LISINOPRIL 10 MG PO TABS
10.0000 mg | ORAL_TABLET | Freq: Every day | ORAL | 3 refills | Status: DC
  Filled 2021-12-01: qty 30, 30d supply, fill #0
  Filled 2021-12-29: qty 30, 30d supply, fill #1
  Filled 2022-01-21: qty 30, 30d supply, fill #2
  Filled 2022-02-25: qty 30, 30d supply, fill #3

## 2021-12-29 ENCOUNTER — Other Ambulatory Visit (HOSPITAL_BASED_OUTPATIENT_CLINIC_OR_DEPARTMENT_OTHER): Payer: Self-pay

## 2022-01-21 ENCOUNTER — Other Ambulatory Visit: Payer: Self-pay | Admitting: Family

## 2022-01-22 ENCOUNTER — Other Ambulatory Visit (HOSPITAL_BASED_OUTPATIENT_CLINIC_OR_DEPARTMENT_OTHER): Payer: Self-pay

## 2022-01-22 MED ORDER — AMLODIPINE BESYLATE 10 MG PO TABS
10.0000 mg | ORAL_TABLET | Freq: Every day | ORAL | 0 refills | Status: DC
  Filled 2022-01-22: qty 30, 30d supply, fill #0

## 2022-01-22 MED ORDER — VENLAFAXINE HCL ER 150 MG PO CP24
150.0000 mg | ORAL_CAPSULE | Freq: Every day | ORAL | 1 refills | Status: DC
  Filled 2022-01-22: qty 90, 90d supply, fill #0
  Filled 2022-04-09: qty 90, 90d supply, fill #1

## 2022-02-25 ENCOUNTER — Other Ambulatory Visit (HOSPITAL_BASED_OUTPATIENT_CLINIC_OR_DEPARTMENT_OTHER): Payer: Self-pay

## 2022-02-25 ENCOUNTER — Other Ambulatory Visit: Payer: Self-pay | Admitting: Family

## 2022-02-26 ENCOUNTER — Other Ambulatory Visit (HOSPITAL_BASED_OUTPATIENT_CLINIC_OR_DEPARTMENT_OTHER): Payer: Self-pay

## 2022-02-26 MED ORDER — AMLODIPINE BESYLATE 10 MG PO TABS
10.0000 mg | ORAL_TABLET | Freq: Every day | ORAL | 0 refills | Status: DC
  Filled 2022-02-26: qty 30, 30d supply, fill #0

## 2022-02-26 NOTE — Telephone Encounter (Signed)
Please contact pt to schedule a follow up visit.  

## 2022-02-27 NOTE — Telephone Encounter (Signed)
Called but no answer, left detail message for patient to call back for appointment

## 2022-04-09 ENCOUNTER — Other Ambulatory Visit: Payer: Self-pay | Admitting: Family

## 2022-04-09 ENCOUNTER — Other Ambulatory Visit (HOSPITAL_BASED_OUTPATIENT_CLINIC_OR_DEPARTMENT_OTHER): Payer: Self-pay

## 2022-04-09 MED ORDER — AMLODIPINE BESYLATE 10 MG PO TABS
10.0000 mg | ORAL_TABLET | Freq: Every day | ORAL | 0 refills | Status: DC
  Filled 2022-04-09: qty 30, 30d supply, fill #0

## 2022-04-09 MED ORDER — LISINOPRIL 10 MG PO TABS
10.0000 mg | ORAL_TABLET | Freq: Every day | ORAL | 3 refills | Status: DC
  Filled 2022-04-09: qty 30, 30d supply, fill #0
  Filled 2022-05-10: qty 30, 30d supply, fill #1
  Filled 2022-06-17: qty 30, 30d supply, fill #2
  Filled 2022-07-17: qty 30, 30d supply, fill #3

## 2022-05-10 ENCOUNTER — Telehealth: Payer: Self-pay | Admitting: Family

## 2022-05-11 ENCOUNTER — Other Ambulatory Visit (HOSPITAL_BASED_OUTPATIENT_CLINIC_OR_DEPARTMENT_OTHER): Payer: Self-pay

## 2022-05-11 MED ORDER — AMLODIPINE BESYLATE 10 MG PO TABS
10.0000 mg | ORAL_TABLET | Freq: Every day | ORAL | 0 refills | Status: DC
Start: 2022-05-11 — End: 2022-08-03
  Filled 2022-05-11: qty 14, 14d supply, fill #0

## 2022-05-11 NOTE — Telephone Encounter (Signed)
Lvm to call back

## 2022-05-11 NOTE — Telephone Encounter (Signed)
Please contact pt to schedule office visit. 

## 2022-06-14 ENCOUNTER — Other Ambulatory Visit: Payer: Self-pay | Admitting: Family

## 2022-06-15 ENCOUNTER — Other Ambulatory Visit (HOSPITAL_BASED_OUTPATIENT_CLINIC_OR_DEPARTMENT_OTHER): Payer: Self-pay

## 2022-06-15 NOTE — Telephone Encounter (Signed)
I have called the pt to let him know that he has not been seen in our office in over a year. (Last OV 03/2021)  I see that he needed refills on medication. In order to get the rx refilled he will need to have an appointment on the books. There was no answer so I left a message to call back.

## 2022-06-18 ENCOUNTER — Other Ambulatory Visit (HOSPITAL_BASED_OUTPATIENT_CLINIC_OR_DEPARTMENT_OTHER): Payer: Self-pay

## 2022-07-17 ENCOUNTER — Other Ambulatory Visit (HOSPITAL_BASED_OUTPATIENT_CLINIC_OR_DEPARTMENT_OTHER): Payer: Self-pay

## 2022-08-03 ENCOUNTER — Other Ambulatory Visit: Payer: Self-pay | Admitting: Family

## 2022-08-03 ENCOUNTER — Other Ambulatory Visit (HOSPITAL_BASED_OUTPATIENT_CLINIC_OR_DEPARTMENT_OTHER): Payer: Self-pay

## 2022-08-03 MED ORDER — VENLAFAXINE HCL ER 150 MG PO CP24
150.0000 mg | ORAL_CAPSULE | Freq: Every day | ORAL | 0 refills | Status: DC
  Filled 2022-08-03: qty 90, 90d supply, fill #0

## 2022-08-03 MED ORDER — AMLODIPINE BESYLATE 10 MG PO TABS
10.0000 mg | ORAL_TABLET | Freq: Every day | ORAL | 0 refills | Status: DC
Start: 2022-08-03 — End: 2022-11-13
  Filled 2022-08-03: qty 90, 90d supply, fill #0

## 2022-08-28 ENCOUNTER — Other Ambulatory Visit (HOSPITAL_BASED_OUTPATIENT_CLINIC_OR_DEPARTMENT_OTHER): Payer: Self-pay

## 2022-08-28 ENCOUNTER — Encounter: Payer: Self-pay | Admitting: Family

## 2022-08-28 ENCOUNTER — Ambulatory Visit (INDEPENDENT_AMBULATORY_CARE_PROVIDER_SITE_OTHER): Payer: PRIVATE HEALTH INSURANCE | Admitting: Family

## 2022-08-28 VITALS — BP 141/102 | HR 103 | Temp 98.8°F | Resp 16 | Ht 69.0 in | Wt 223.0 lb

## 2022-08-28 DIAGNOSIS — I10 Essential (primary) hypertension: Secondary | ICD-10-CM

## 2022-08-28 DIAGNOSIS — Z23 Encounter for immunization: Secondary | ICD-10-CM

## 2022-08-28 DIAGNOSIS — F418 Other specified anxiety disorders: Secondary | ICD-10-CM

## 2022-08-28 DIAGNOSIS — R635 Abnormal weight gain: Secondary | ICD-10-CM | POA: Diagnosis not present

## 2022-08-28 DIAGNOSIS — Z0000 Encounter for general adult medical examination without abnormal findings: Secondary | ICD-10-CM | POA: Diagnosis not present

## 2022-08-28 HISTORY — DX: Encounter for general adult medical examination without abnormal findings: Z00.00

## 2022-08-28 MED ORDER — LISINOPRIL 20 MG PO TABS
20.0000 mg | ORAL_TABLET | Freq: Every day | ORAL | 1 refills | Status: DC
  Filled 2022-08-28 – 2022-12-12 (×2): qty 90, 90d supply, fill #0

## 2022-08-28 NOTE — Assessment & Plan Note (Signed)
BP Readings from Last 3 Encounters:  08/28/22 (!) 141/102  03/28/21 134/86  03/24/21 (!) 155/109   Uncontrolled. Will increase lisinopril from 10mg  to 20mg . Continue amlodipine.

## 2022-08-28 NOTE — Assessment & Plan Note (Signed)
Stable on effexor, continue same. 

## 2022-08-28 NOTE — Assessment & Plan Note (Signed)
Discussed healthy diet, exercise, weight loss. Recommended that he limit alcohol to 2 or less drinks/day.  Flu shot today. Recommended that he get covid booster at his pharmacy.

## 2022-08-28 NOTE — Progress Notes (Signed)
Subjective:     Patient ID: John Leach, male    DOB: 1981/04/04, 42 y.o.   MRN: [id number]  Chief Complaint  Patient presents with   Hypertension    Here for follow up   Nevus    Patient reports having a mole on back of right leg    Hypertension Pertinent negatives include no blurred vision or headaches.   Patient is in today for follow up and cpx.  HTN- maintained on lisinopril 10mg  and amlodipine 10mg .   BP Readings from Last 3 Encounters:  08/28/22 (!) 141/102  03/28/21 134/86  03/24/21 (!) 155/109   Wt Readings from Last 3 Encounters:  08/28/22 223 lb (101.2 kg)  03/28/21 204 lb (92.5 kg)  03/24/21 200 lb (90.7 kg)   Depression/anxiety- Reports mood is good on effexor.   Immunizations: would like flu shot today.  Diet:  plans to work on diet/exercise, he has gained some weight which he attributes to being less active.  Exercise: 2 days a week  Vision: due  Dental: due, scheduled   Health Maintenance Due  Topic Date Due   HIV Screening  Never done   Hepatitis C Screening  Never done   COVID-19 Vaccine (4 - 2023-24 season) 04/17/2022    Past Medical History:  Diagnosis Date   Depression with anxiety    Depression with anxiety    GERD (gastroesophageal reflux disease)    Hypertension 12/20/2020   Preventative health care 08/28/2022    History reviewed. No pertinent surgical history.  Family History  Problem Relation Age of Onset   Heart attack Father    Heart disease Father 62       MI   Osteoarthritis Mother    CAD Maternal Grandfather    Cancer Maternal Grandfather    Colon cancer Paternal Grandfather        died at 65    Social History   Socioeconomic History   Marital status: Married    Spouse name: Not on file   Number of children: Not on file   Years of education: Not on file   Highest education level: Not on file  Occupational History   Not on file  Tobacco Use   Smoking status: Former    Types: Cigarettes    Quit date:  10/07/2007    Years since quitting: 14.9   Smokeless tobacco: Never  Vaping Use   Vaping Use: Every day   Substances: Nicotine  Substance and Sexual Activity   Alcohol use: Yes    Comment: 4-5 drinks about 3 days week (hard selzers)   Drug use: No   Sexual activity: Yes    Partners: Female  Other Topics Concern   Not on file  Social History Narrative   Married   Works for a Radiation protection practitioner, Patent attorney   2 girls   Completed some college   Enjoys running   Social Determinants of Radio broadcast assistant Strain: Not on file  Food Insecurity: Not on file  Transportation Needs: Not on file  Physical Activity: Not on file  Stress: Not on file  Social Connections: Not on file  Intimate Partner Violence: Not on file    Outpatient Medications Prior to Visit  Medication Sig Dispense Refill   amLODipine (NORVASC) 10 MG tablet Take 1 tablet (10 mg total) by mouth daily. Must make an appt before further refills 90 tablet 0   venlafaxine XR (EFFEXOR-XR) 150 MG 24 hr capsule Take 1 capsule (  150 mg total) by mouth daily with breakfast. 90 capsule 0   lisinopril (ZESTRIL) 10 MG tablet Take 1 tablet (10 mg total) by mouth daily. 30 tablet 3   No facility-administered medications prior to visit.    No Known Allergies  Review of Systems  Constitutional:  Negative for weight loss.  HENT:  Negative for congestion and hearing loss.   Eyes:  Negative for blurred vision.  Respiratory:  Negative for cough.   Cardiovascular:  Negative for leg swelling.  Gastrointestinal:  Negative for blood in stool, constipation and diarrhea.  Genitourinary:  Negative for dysuria and frequency.  Musculoskeletal:  Negative for joint pain and myalgias.  Skin:  Negative for rash.  Neurological:  Negative for headaches.  Psychiatric/Behavioral:         Stable- see HPI.        Objective:    Physical Exam  BP (!) 141/102 (BP Location: Right Arm, Patient Position: Sitting, Cuff Size: Large)   Pulse (!)  103   Temp 98.8 F (37.1 C) (Oral)   Resp 16   Ht 5\' 9"  (1.753 m)   Wt 223 lb (101.2 kg)   SpO2 97%   BMI 32.93 kg/m  Wt Readings from Last 3 Encounters:  08/28/22 223 lb (101.2 kg)  03/28/21 204 lb (92.5 kg)  03/24/21 200 lb (90.7 kg)       Assessment & Plan:   Problem List Items Addressed This Visit       Unprioritized   Weight gain   Relevant Orders   TSH   Preventative health care - Primary    Discussed healthy diet, exercise, weight loss. Recommended that he limit alcohol to 2 or less drinks/day.  Flu shot today. Recommended that he get covid booster at his pharmacy.        Relevant Orders   CBC w/Diff   Hypertension    BP Readings from Last 3 Encounters:  08/28/22 (!) 141/102  03/28/21 134/86  03/24/21 (!) 155/109  Uncontrolled. Will increase lisinopril from 10mg  to 20mg . Continue amlodipine.       Relevant Medications   lisinopril (ZESTRIL) 20 MG tablet   Other Relevant Orders   Comp Met (CMET)   Lipid panel   Depression with anxiety    Stable on effexor, continue same.       Other Visit Diagnoses     Needs flu shot       Relevant Orders   Flu Vaccine QUAD 6+ mos PF IM (Fluarix Quad PF) (Completed)       I have discontinued Adante A. Florer's lisinopril. I am also having him start on lisinopril. Additionally, I am having him maintain his venlafaxine XR and amLODipine.  Meds ordered this encounter  Medications   lisinopril (ZESTRIL) 20 MG tablet    Sig: Take 1 tablet (20 mg total) by mouth daily.    Dispense:  90 tablet    Refill:  1    Order Specific Question:   Supervising Provider    Answer:   Penni Homans A [[id number]]

## 2022-08-29 LAB — LIPID PANEL
Cholesterol: 215 mg/dL — ABNORMAL HIGH (ref <200)
HDL: 61 mg/dL (ref >=40)
LDL Cholesterol (Calc): 132 mg/dL (calc) — ABNORMAL HIGH
Non-HDL Cholesterol (Calc): 154 mg/dL (calc) — ABNORMAL HIGH (ref <130)
Total CHOL/HDL Ratio: 3.5 (calc) (ref <5.0)
Triglycerides: 116 mg/dL (ref <150)

## 2022-08-29 LAB — CBC WITH DIFFERENTIAL/PLATELET
Absolute Monocytes: 622 cells/uL (ref 200–950)
Basophils Absolute: 82 cells/uL (ref 0–200)
Basophils Relative: 1.6 %
Eosinophils Absolute: 61 cells/uL (ref 15–500)
Eosinophils Relative: 1.2 %
HCT: 43.2 % (ref 38.5–50.0)
Hemoglobin: 15.2 g/dL (ref 13.2–17.1)
Lymphs Abs: 1367 cells/uL (ref 850–3900)
MCH: 31.9 pg (ref 27.0–33.0)
MCHC: 35.2 g/dL (ref 32.0–36.0)
MCV: 90.6 fL (ref 80.0–100.0)
MPV: 9.9 fL (ref 7.5–12.5)
Monocytes Relative: 12.2 %
Neutro Abs: 2968 cells/uL (ref 1500–7800)
Neutrophils Relative %: 58.2 %
Platelets: 234 10*3/uL (ref 140–400)
RBC: 4.77 10*6/uL (ref 4.20–5.80)
RDW: 12.2 % (ref 11.0–15.0)
Total Lymphocyte: 26.8 %
WBC: 5.1 10*3/uL (ref 3.8–10.8)

## 2022-08-29 LAB — COMPREHENSIVE METABOLIC PANEL
AG Ratio: 2 (calc) (ref 1.0–2.5)
ALT: 37 U/L (ref 9–46)
AST: 25 U/L (ref 10–40)
Albumin: 4.7 g/dL (ref 3.6–5.1)
Alkaline phosphatase (APISO): 75 U/L (ref 36–130)
BUN: 19 mg/dL (ref 7–25)
CO2: 23 mmol/L (ref 20–32)
Calcium: 9.2 mg/dL (ref 8.6–10.3)
Chloride: 103 mmol/L (ref 98–110)
Creat: 1.03 mg/dL (ref 0.60–1.29)
Globulin: 2.4 g/dL (calc) (ref 1.9–3.7)
Glucose, Bld: 81 mg/dL (ref 65–99)
Potassium: 4.3 mmol/L (ref 3.5–5.3)
Sodium: 138 mmol/L (ref 135–146)
Total Bilirubin: 0.5 mg/dL (ref 0.2–1.2)
Total Protein: 7.1 g/dL (ref 6.1–8.1)

## 2022-08-29 LAB — TSH: TSH: 2.88 mIU/L (ref 0.40–4.50)

## 2022-09-08 ENCOUNTER — Encounter: Payer: Self-pay | Admitting: Family

## 2022-09-15 ENCOUNTER — Ambulatory Visit: Payer: PRIVATE HEALTH INSURANCE | Admitting: Family

## 2022-09-21 ENCOUNTER — Ambulatory Visit: Payer: PRIVATE HEALTH INSURANCE | Admitting: Family

## 2022-09-22 ENCOUNTER — Ambulatory Visit: Payer: PRIVATE HEALTH INSURANCE | Admitting: Family

## 2022-09-25 ENCOUNTER — Ambulatory Visit: Payer: PRIVATE HEALTH INSURANCE | Admitting: Family

## 2022-09-25 VITALS — BP 131/83 | HR 110 | Temp 98.4°F | Resp 16 | Wt 228.0 lb

## 2022-09-25 DIAGNOSIS — L821 Other seborrheic keratosis: Secondary | ICD-10-CM | POA: Diagnosis not present

## 2022-09-25 DIAGNOSIS — I10 Essential (primary) hypertension: Secondary | ICD-10-CM | POA: Diagnosis not present

## 2022-09-25 DIAGNOSIS — L989 Disorder of the skin and subcutaneous tissue, unspecified: Secondary | ICD-10-CM

## 2022-09-25 NOTE — Assessment & Plan Note (Signed)
Stable/improved.  Continue lisinopril 69m along with amlodipine 10 mg.

## 2022-09-25 NOTE — Assessment & Plan Note (Signed)
New.  Procedure including risks/benefits explained to patient.  Questions were answered. After informed consent was obtained and a time out completed, site was cleansed with betadine. 1% Lidocaine with epinephrine was injected under lesion and then shave removal was performed. Area was cauterized to obtain hemostasis.  Pt tolerated procedure well.  Specimen sent for pathology review.  Pt instructed to keep the area dry for 24 hours and to contact us if he develops redness, drainage or swelling at the site.  Pt may use tylenol as needed for discomfort today.

## 2022-09-25 NOTE — Progress Notes (Signed)
Subjective:   By signing my name below, I, [name], attest that this documentation has been prepared under the direction and in the presence of Debbrah Alar, NP. 09/25/2022.   Patient ID: John Leach, male    DOB: Jun 18, 1981, 42 y.o.   MRN: [id number]  Chief Complaint  Patient presents with   Hypertension    Here for follow up   Nevus    Here for mole removal    HPI Patient is in today for an office visit.  Blood Pressure: At his last appointment we increased lisinopril from 10 mg to 20 mg. He is tolerating the higher dose and his blood pressures have improved.  BP Readings from Last 3 Encounters:  09/25/22 131/83  08/28/22 (!) 141/102  03/28/21 134/86   Mole Removal: Located on the right upper medial thigh. Will be removed today.  Denies having any fever, new muscle pain, joint pain, new moles, congestion, sinus pain, sore throat, chest pain, palpitations, cough, SOB, wheezing, n/v/d, constipation, blood in stool, dysuria, frequency, hematuria, at this time.  Past Medical History:  Diagnosis Date   Depression with anxiety    Depression with anxiety    GERD (gastroesophageal reflux disease)    Hypertension 12/20/2020   Preventative health care 08/28/2022    No past surgical history on file.  Family History  Problem Relation Age of Onset   Heart attack Father    Heart disease Father 26       MI   Osteoarthritis Mother    CAD Maternal Grandfather    Cancer Maternal Grandfather    Colon cancer Paternal Grandfather        died at 20    Social History   Socioeconomic History   Marital status: Married    Spouse name: Not on file   Number of children: Not on file   Years of education: Not on file   Highest education level: Not on file  Occupational History   Not on file  Tobacco Use   Smoking status: Former    Types: Cigarettes    Quit date: 10/07/2007    Years since quitting: 14.9   Smokeless tobacco: Never  Vaping Use   Vaping Use: Every  day   Substances: Nicotine  Substance and Sexual Activity   Alcohol use: Yes    Comment: 4-5 drinks about 3 days week (hard selzers)   Drug use: No   Sexual activity: Yes    Partners: Female  Other Topics Concern   Not on file  Social History Narrative   Married   Works for a Radiation protection practitioner, Patent attorney   2 girls   Completed some college   Enjoys running   Social Determinants of Radio broadcast assistant Strain: Not on file  Food Insecurity: Not on file  Transportation Needs: Not on file  Physical Activity: Not on file  Stress: Not on file  Social Connections: Not on file  Intimate Partner Violence: Not on file    Outpatient Medications Prior to Visit  Medication Sig Dispense Refill   amLODipine (NORVASC) 10 MG tablet Take 1 tablet (10 mg total) by mouth daily. Must make an appt before further refills 90 tablet 0   lisinopril (ZESTRIL) 20 MG tablet Take 1 tablet (20 mg total) by mouth daily. 90 tablet 1   venlafaxine XR (EFFEXOR-XR) 150 MG 24 hr capsule Take 1 capsule (150 mg total) by mouth daily with breakfast. 90 capsule 0   No facility-administered medications  prior to visit.    No Known Allergies  Review of Systems  Constitutional:  Negative for fever.  HENT:  Negative for congestion, sinus pain and sore throat.   Respiratory:  Negative for cough, shortness of breath and wheezing.   Cardiovascular:  Negative for chest pain and palpitations.  Gastrointestinal:  Negative for blood in stool, constipation, diarrhea, nausea and vomiting.  Genitourinary:  Negative for dysuria, frequency and hematuria.  Musculoskeletal:  Negative for joint pain and myalgias.       Objective:    Physical Exam Constitutional:      General: He is not in acute distress.    Appearance: Normal appearance. He is not ill-appearing.  HENT:     Head: Normocephalic and atraumatic.     Right Ear: Tympanic membrane, ear canal and external ear normal.     Left Ear: Tympanic membrane, ear  canal and external ear normal.  Eyes:     Extraocular Movements: Extraocular movements intact.     Pupils: Pupils are equal, round, and reactive to light.  Cardiovascular:     Rate and Rhythm: Normal rate and regular rhythm.     Heart sounds: Normal heart sounds. No murmur heard.    No gallop.  Pulmonary:     Effort: Pulmonary effort is normal. No respiratory distress.     Breath sounds: Normal breath sounds. No wheezing or rales.  Skin:    General: Skin is warm and dry.     Comments: Lesion removed from right upper medial thigh. Approximately 5 mm wide  Neurological:     General: No focal deficit present.     Mental Status: He is alert and oriented to person, place, and time.  Psychiatric:        Mood and Affect: Mood normal.        Behavior: Behavior normal.     BP 131/83 (BP Location: Right Arm, Patient Position: Sitting, Cuff Size: Large)   Pulse (!) 110   Temp 98.4 F (36.9 C) (Oral)   Resp 16   Wt 228 lb (103.4 kg)   SpO2 98%   BMI 33.67 kg/m  Wt Readings from Last 3 Encounters:  09/25/22 228 lb (103.4 kg)  08/28/22 223 lb (101.2 kg)  03/28/21 204 lb (92.5 kg)      Assessment & Plan:   Problem List Items Addressed This Visit       Unprioritized   Skin lesion    New.  Procedure including risks/benefits explained to patient.  Questions were answered. After informed consent was obtained and a time out completed, site was cleansed with betadine. 1% Lidocaine with epinephrine was injected under lesion and then shave removal was performed. Area was cauterized to obtain hemostasis.  Pt tolerated procedure well.  Specimen sent for pathology review.  Pt instructed to keep the area dry for 24 hours and to contact us if he develops redness, drainage or swelling at the site.  Pt may use tylenol as needed for discomfort today.        Relevant Orders   Surgical pathology( [place]/ [facility])   Hypertension - Primary    Stable/improved.  Continue lisinopril 61m along  with amlodipine 10 mg.        Relevant Orders   Basic Metabolic Panel (BMET)     No orders of the defined types were placed in this encounter.   I, MNance Pear NP, personally preformed the services described in this documentation.  All medical record entries made  by the scribe were at my direction and in my presence.  I have reviewed the chart and discharge instructions (if applicable) and agree that the record reflects my personal performance and is accurate and complete. 09/25/2022.  I,Mathew Stumpf,acting as a Education administrator for [employer], NP.,have documented all relevant documentation on the behalf of Nance Pear, NP,as directed by  Nance Pear, NP while in the presence of Nance Pear, NP.   Nance Pear, NP

## 2022-10-01 ENCOUNTER — Other Ambulatory Visit (HOSPITAL_COMMUNITY): Payer: Self-pay

## 2022-10-02 ENCOUNTER — Other Ambulatory Visit: Payer: PRIVATE HEALTH INSURANCE

## 2022-10-09 ENCOUNTER — Other Ambulatory Visit (INDEPENDENT_AMBULATORY_CARE_PROVIDER_SITE_OTHER): Payer: PRIVATE HEALTH INSURANCE

## 2022-10-09 DIAGNOSIS — I10 Essential (primary) hypertension: Secondary | ICD-10-CM

## 2022-10-09 NOTE — Addendum Note (Signed)
Addended by: Manuela Schwartz on: 10/09/2022 03:18 PM   Modules accepted: Orders

## 2022-10-10 LAB — BASIC METABOLIC PANEL
BUN: 19 mg/dL (ref 7–25)
CO2: 23 mmol/L (ref 20–32)
Calcium: 9.7 mg/dL (ref 8.6–10.3)
Chloride: 104 mmol/L (ref 98–110)
Creat: 1.09 mg/dL (ref 0.60–1.29)
Glucose, Bld: 84 mg/dL (ref 65–99)
Potassium: 4.4 mmol/L (ref 3.5–5.3)
Sodium: 137 mmol/L (ref 135–146)

## 2022-11-13 ENCOUNTER — Other Ambulatory Visit: Payer: Self-pay | Admitting: Family

## 2022-11-13 ENCOUNTER — Other Ambulatory Visit (HOSPITAL_BASED_OUTPATIENT_CLINIC_OR_DEPARTMENT_OTHER): Payer: Self-pay

## 2022-11-14 ENCOUNTER — Other Ambulatory Visit: Payer: Self-pay

## 2022-11-14 MED ORDER — AMLODIPINE BESYLATE 10 MG PO TABS
10.0000 mg | ORAL_TABLET | Freq: Every day | ORAL | 0 refills | Status: DC
  Filled 2022-11-14: qty 90, 90d supply, fill #0

## 2022-11-14 MED ORDER — VENLAFAXINE HCL ER 150 MG PO CP24
150.0000 mg | ORAL_CAPSULE | Freq: Every day | ORAL | 0 refills | Status: DC
  Filled 2022-11-14: qty 90, 90d supply, fill #0

## 2022-11-16 ENCOUNTER — Other Ambulatory Visit (HOSPITAL_BASED_OUTPATIENT_CLINIC_OR_DEPARTMENT_OTHER): Payer: Self-pay

## 2022-12-12 ENCOUNTER — Other Ambulatory Visit (HOSPITAL_COMMUNITY): Payer: Self-pay

## 2022-12-25 ENCOUNTER — Other Ambulatory Visit (HOSPITAL_BASED_OUTPATIENT_CLINIC_OR_DEPARTMENT_OTHER): Payer: Self-pay

## 2023-02-02 ENCOUNTER — Other Ambulatory Visit (HOSPITAL_COMMUNITY): Payer: Self-pay

## 2023-03-02 ENCOUNTER — Other Ambulatory Visit (HOSPITAL_BASED_OUTPATIENT_CLINIC_OR_DEPARTMENT_OTHER): Payer: Self-pay

## 2023-03-02 ENCOUNTER — Other Ambulatory Visit: Payer: Self-pay | Admitting: Family

## 2023-03-02 MED ORDER — VENLAFAXINE HCL ER 150 MG PO CP24
150.0000 mg | ORAL_CAPSULE | Freq: Every day | ORAL | 0 refills | Status: DC
  Filled 2023-03-02: qty 90, 90d supply, fill #0

## 2023-03-02 MED ORDER — AMLODIPINE BESYLATE 10 MG PO TABS
10.0000 mg | ORAL_TABLET | Freq: Every day | ORAL | 0 refills | Status: DC
  Filled 2023-03-02: qty 90, 90d supply, fill #0

## 2023-03-04 ENCOUNTER — Other Ambulatory Visit (HOSPITAL_BASED_OUTPATIENT_CLINIC_OR_DEPARTMENT_OTHER): Payer: Self-pay

## 2023-03-05 ENCOUNTER — Other Ambulatory Visit (HOSPITAL_BASED_OUTPATIENT_CLINIC_OR_DEPARTMENT_OTHER): Payer: Self-pay

## 2023-03-26 ENCOUNTER — Ambulatory Visit: Payer: PRIVATE HEALTH INSURANCE | Admitting: Family

## 2023-04-03 ENCOUNTER — Other Ambulatory Visit (HOSPITAL_COMMUNITY): Payer: Self-pay

## 2023-04-03 ENCOUNTER — Other Ambulatory Visit: Payer: Self-pay | Admitting: Family

## 2023-04-03 MED ORDER — LISINOPRIL 20 MG PO TABS
20.0000 mg | ORAL_TABLET | Freq: Every day | ORAL | 0 refills | Status: DC
  Filled 2023-04-03: qty 30, 30d supply, fill #0

## 2023-04-03 NOTE — Telephone Encounter (Signed)
Pt due for follow up visit.  Please contact to schedule.

## 2023-05-25 ENCOUNTER — Other Ambulatory Visit: Payer: Self-pay | Admitting: Family

## 2023-05-25 ENCOUNTER — Other Ambulatory Visit (HOSPITAL_BASED_OUTPATIENT_CLINIC_OR_DEPARTMENT_OTHER): Payer: Self-pay

## 2023-05-25 MED ORDER — LISINOPRIL 20 MG PO TABS
20.0000 mg | ORAL_TABLET | Freq: Every day | ORAL | 0 refills | Status: DC
  Filled 2023-05-25: qty 30, 30d supply, fill #0

## 2023-05-25 MED ORDER — AMLODIPINE BESYLATE 10 MG PO TABS
10.0000 mg | ORAL_TABLET | Freq: Every day | ORAL | 0 refills | Status: DC
  Filled 2023-05-25: qty 90, 90d supply, fill #0

## 2023-05-25 MED ORDER — VENLAFAXINE HCL ER 150 MG PO CP24
150.0000 mg | ORAL_CAPSULE | Freq: Every day | ORAL | 0 refills | Status: DC
  Filled 2023-05-25: qty 90, 90d supply, fill #0

## 2023-05-25 NOTE — Telephone Encounter (Signed)
Appt 06/04/23

## 2023-06-04 ENCOUNTER — Ambulatory Visit (INDEPENDENT_AMBULATORY_CARE_PROVIDER_SITE_OTHER): Payer: PRIVATE HEALTH INSURANCE | Admitting: Family

## 2023-06-04 ENCOUNTER — Other Ambulatory Visit (HOSPITAL_BASED_OUTPATIENT_CLINIC_OR_DEPARTMENT_OTHER): Payer: Self-pay

## 2023-06-04 VITALS — BP 127/82 | HR 54 | Temp 98.6°F | Resp 16 | Ht 68.0 in | Wt 225.0 lb

## 2023-06-04 DIAGNOSIS — Z23 Encounter for immunization: Secondary | ICD-10-CM | POA: Diagnosis not present

## 2023-06-04 DIAGNOSIS — F418 Other specified anxiety disorders: Secondary | ICD-10-CM

## 2023-06-04 DIAGNOSIS — L989 Disorder of the skin and subcutaneous tissue, unspecified: Secondary | ICD-10-CM | POA: Diagnosis not present

## 2023-06-04 DIAGNOSIS — I10 Essential (primary) hypertension: Secondary | ICD-10-CM

## 2023-06-04 MED ORDER — VENLAFAXINE HCL ER 75 MG PO CP24
75.0000 mg | ORAL_CAPSULE | Freq: Every day | ORAL | 1 refills | Status: DC
  Filled 2023-06-04: qty 30, 30d supply, fill #0

## 2023-06-04 NOTE — Assessment & Plan Note (Signed)
Was benign.

## 2023-06-04 NOTE — Assessment & Plan Note (Signed)
Stable on effexor, wishes to trial off.  Will decrease from 150mg  xr to 75mg  xr and he will let me know how he is doing in a few weeks.

## 2023-06-04 NOTE — Patient Instructions (Signed)
Please decrease effexor xr from 150mg  to 75mg  xr once daily. Send me a note in a few weeks to let me know how you are feeling on this decreased dose.

## 2023-06-04 NOTE — Addendum Note (Signed)
Addended by: Wilford Corner on: 06/04/2023 03:35 PM   Modules accepted: Orders

## 2023-06-04 NOTE — Assessment & Plan Note (Signed)
BP stable on lisinopril/amlodipine.  Continue same.

## 2023-06-04 NOTE — Progress Notes (Signed)
Subjective:     Patient ID: John Leach, male    DOB: 1981-02-27, 42 y.o.   MRN: [id number]  Chief Complaint  Patient presents with   Hypertension    Here for follow up   Anxiety    "Will like to get off efexor", "feeling better"    Hypertension Associated symptoms include anxiety.  Anxiety      Discussed the use of AI scribe software for clinical note transcription with the patient, who gave verbal consent to proceed.  History of Present Illness         HTN- BP Readings from Last 3 Encounters:  06/04/23 127/82  09/25/22 131/83  08/28/22 (!) 141/102   Maintained on lisinopril/amlodipine.  Depression/anxiety- maintained on effexor xr.  Interested in trying to wean off as he feels like he is in a better mental place now that he was back when he started the medication.  Skin lesion- mole removal last visit was benign, path showed seborrheic keratosis.      Health Maintenance Due  Topic Date Due   INFLUENZA VACCINE  03/18/2023   COVID-19 Vaccine (4 - 2023-24 season) 04/18/2023    Past Medical History:  Diagnosis Date   Depression with anxiety    Depression with anxiety    GERD (gastroesophageal reflux disease)    Hypertension 12/20/2020   Preventative health care 08/28/2022    No past surgical history on file.  Family History  Problem Relation Age of Onset   Heart attack Father    Heart disease Father 53       MI   Osteoarthritis Mother    CAD Maternal Grandfather    Cancer Maternal Grandfather    Colon cancer Paternal Grandfather        died at 60    Social History   Socioeconomic History   Marital status: Married    Spouse name: Not on file   Number of children: Not on file   Years of education: Not on file   Highest education level: Associate degree: occupational, Scientist, product/process development, or vocational program  Occupational History   Not on file  Tobacco Use   Smoking status: Former    Current packs/day: 0.00    Types: Cigarettes    Quit date:  10/07/2007    Years since quitting: 15.6   Smokeless tobacco: Never  Vaping Use   Vaping status: Every Day   Substances: Nicotine  Substance and Sexual Activity   Alcohol use: Yes    Comment: 4-5 drinks about 3 days week (hard selzers)   Drug use: No   Sexual activity: Yes    Partners: Female  Other Topics Concern   Not on file  Social History Narrative   Married   Works for a Naval architect, [employer] operator   2 girls   Completed some college   Enjoys running   Social Determinants of Health   Financial Resource Strain: Low Risk  (06/01/2023)   Overall Financial Resource Strain (CARDIA)    Difficulty of Paying Living Expenses: Not hard at all  Food Insecurity: No Food Insecurity (06/01/2023)   Hunger Vital Sign    Worried About Running Out of Food in the Last Year: Never true    Ran Out of Food in the Last Year: Never true  Transportation Needs: No Transportation Needs (06/01/2023)   PRAPARE - Administrator, Civil Service (Medical): No    Lack of Transportation (Non-Medical): No  Physical Activity: Sufficiently Active (06/01/2023)  Exercise Vital Sign    Days of Exercise per Week: 5 days    Minutes of Exercise per Session: 30 min  Stress: No Stress Concern Present (06/01/2023)   [employer] of Occupational Health - Occupational Stress Questionnaire    Feeling of Stress : Not at all  Social Connections: Moderately Isolated (06/01/2023)   Social Connection and Isolation Panel [NHANES]    Frequency of Communication with Friends and Family: More than three times a week    Frequency of Social Gatherings with Friends and Family: Patient declined    Attends Religious Services: Never    Database administrator or Organizations: No    Attends Engineer, structural: Not on file    Marital Status: Married  Catering manager Violence: Not on file    Outpatient Medications Prior to Visit  Medication Sig Dispense Refill   amLODipine (NORVASC) 10 MG tablet Take  1 tablet (10 mg total) by mouth daily. Due for appt 03/2023 90 tablet 0   lisinopril (ZESTRIL) 20 MG tablet Take 1 tablet (20 mg total) by mouth daily. 30 tablet 0   venlafaxine XR (EFFEXOR-XR) 150 MG 24 hr capsule Take 1 capsule (150 mg total) by mouth daily with breakfast. Due for appt 03/2023 90 capsule 0   No facility-administered medications prior to visit.    No Known Allergies  ROS See HPI    Objective:    Physical Exam Constitutional:      General: He is not in acute distress.    Appearance: He is well-developed.  HENT:     Head: Normocephalic and atraumatic.  Cardiovascular:     Rate and Rhythm: Normal rate and regular rhythm.     Heart sounds: No murmur heard. Pulmonary:     Effort: Pulmonary effort is normal. No respiratory distress.     Breath sounds: Normal breath sounds. No wheezing or rales.  Skin:    General: Skin is warm and dry.  Neurological:     Mental Status: He is alert and oriented to person, place, and time.  Psychiatric:        Behavior: Behavior normal.        Thought Content: Thought content normal.      BP 127/82 (BP Location: Right Arm, Patient Position: Sitting, Cuff Size: Large)   Pulse (!) 54   Temp 98.6 F (37 C) (Oral)   Resp 16   Ht 5\' 8"  (1.727 m)   Wt 225 lb (102.1 kg)   SpO2 98%   BMI 34.21 kg/m  Wt Readings from Last 3 Encounters:  06/04/23 225 lb (102.1 kg)  09/25/22 228 lb (103.4 kg)  08/28/22 223 lb (101.2 kg)       Assessment & Plan:   Problem List Items Addressed This Visit       Unprioritized   Skin lesion    Was benign.       Hypertension - Primary    BP stable on lisinopril/amlodipine.  Continue same.       Relevant Orders   Basic Metabolic Panel (BMET)   Depression with anxiety    Stable on effexor, wishes to trial off.  Will decrease from 150mg  xr to 75mg  xr and he will let me know how he is doing in a few weeks.        Relevant Medications   venlafaxine XR (EFFEXOR XR) 75 MG 24 hr capsule     I have discontinued John Leach's venlafaxine XR. I am also having  him start on venlafaxine XR. Additionally, I am having him maintain his amLODipine and lisinopril.  Meds ordered this encounter  Medications   venlafaxine XR (EFFEXOR XR) 75 MG 24 hr capsule    Sig: Take 1 capsule (75 mg total) by mouth daily with breakfast.    Dispense:  30 capsule    Refill:  1    Order Specific Question:   Supervising Provider    Answer:   Danise Edge A [[id number]]

## 2023-06-08 LAB — BASIC METABOLIC PANEL
BUN: 16 mg/dL (ref 7–25)
CO2: 29 mmol/L (ref 20–32)
Calcium: 9.9 mg/dL (ref 8.6–10.3)
Chloride: 101 mmol/L (ref 98–110)
Creat: 0.99 mg/dL (ref 0.60–1.29)
Glucose, Bld: 83 mg/dL (ref 65–99)
Potassium: 4.3 mmol/L (ref 3.5–5.3)
Sodium: 137 mmol/L (ref 135–146)

## 2023-06-08 LAB — SPECIMEN COMPROMISED

## 2023-06-28 ENCOUNTER — Telehealth: Payer: Self-pay | Admitting: Family

## 2023-06-28 ENCOUNTER — Other Ambulatory Visit (HOSPITAL_BASED_OUTPATIENT_CLINIC_OR_DEPARTMENT_OTHER): Payer: Self-pay

## 2023-06-28 ENCOUNTER — Other Ambulatory Visit: Payer: Self-pay | Admitting: Family

## 2023-06-28 MED ORDER — LISINOPRIL 20 MG PO TABS
20.0000 mg | ORAL_TABLET | Freq: Every day | ORAL | 0 refills | Status: DC
  Filled 2023-06-28: qty 30, 30d supply, fill #0

## 2023-06-28 NOTE — Telephone Encounter (Signed)
See mychart.  

## 2023-06-29 MED ORDER — VENLAFAXINE HCL ER 37.5 MG PO CP24
ORAL_CAPSULE | ORAL | 0 refills | Status: DC
  Filled 2023-06-29: qty 30, 28d supply, fill #0

## 2023-06-29 NOTE — Addendum Note (Signed)
Addended by: Sandford Craze on: 06/29/2023 06:21 PM   Modules accepted: Orders

## 2023-06-30 ENCOUNTER — Other Ambulatory Visit (HOSPITAL_BASED_OUTPATIENT_CLINIC_OR_DEPARTMENT_OTHER): Payer: Self-pay

## 2023-07-04 ENCOUNTER — Encounter: Payer: Self-pay | Admitting: Emergency Medicine

## 2023-07-04 ENCOUNTER — Ambulatory Visit
Admission: EM | Admit: 2023-07-04 | Discharge: 2023-07-04 | Disposition: A | Discharge status: Home / Self Care | Payer: PRIVATE HEALTH INSURANCE | Attending: Family Medicine | Admitting: Family Medicine

## 2023-07-04 ENCOUNTER — Other Ambulatory Visit: Payer: Self-pay

## 2023-07-04 ENCOUNTER — Ambulatory Visit: Payer: PRIVATE HEALTH INSURANCE

## 2023-07-04 DIAGNOSIS — M25512 Pain in left shoulder: Secondary | ICD-10-CM

## 2023-07-04 DIAGNOSIS — S46911A Strain of unspecified muscle, fascia and tendon at shoulder and upper arm level, right arm, initial encounter: Secondary | ICD-10-CM

## 2023-07-04 DIAGNOSIS — M25511 Pain in right shoulder: Secondary | ICD-10-CM

## 2023-07-04 DIAGNOSIS — S46912A Strain of unspecified muscle, fascia and tendon at shoulder and upper arm level, left arm, initial encounter: Secondary | ICD-10-CM | POA: Diagnosis not present

## 2023-07-04 MED ORDER — CELECOXIB 200 MG PO CAPS
200.0000 mg | ORAL_CAPSULE | Freq: Every day | ORAL | 0 refills | Status: DC
Start: 2023-07-04 — End: 2023-07-12

## 2023-07-04 MED ORDER — HYDROCODONE-ACETAMINOPHEN 5-325 MG PO TABS: 1.0000 | ORAL_TABLET | Freq: Three times a day (TID) | ORAL | 0 refills | Status: AC | PRN

## 2023-07-04 NOTE — Discharge Instructions (Addendum)
Advised patient to take medication as directed with food to completion.  Advised patient may take Norco for breakthrough/severe right shoulder pain.  Advised patient of sedate of properties of this medication.  Encourage to increase daily water intake to 64 ounces per day while taking his medication.  Advised if symptoms worsen and/or unresolved please follow-up with [facility] for further evaluation.

## 2023-07-04 NOTE — ED Triage Notes (Addendum)
Patient presents to Urgent Care with complaints of rib pain since 3- 4 days ago. Patient reports had coughing spells. He states while coughing something popped. Now having pain in right back. Has pain comes with sneezing or coughing. Pain located underneath shoulder blade. Pain with deep breath. Not short of breath. Nothing seems to help. Taking Advil with minimum relief.

## 2023-07-04 NOTE — ED Provider Notes (Signed)
[name]    CSN: [id number] Arrival date & time: 07/04/23  1144      History   Chief Complaint Chief Complaint  Patient presents with   Rib Injury   Back Pain    HPI John Leach is a 42 y.o. male.   HPI 42 year old male presents with left shoulder pain for 3-4 days.  Patient reports he has had coughing spells and heard a pop in his right posterior shoulder region after 1 of these spells.  Patient is accompanied by his wife this afternoon.  PMH significant for obesity, HTN, and depression with anxiety.  Past Medical History:  Diagnosis Date   Depression with anxiety    Depression with anxiety    GERD (gastroesophageal reflux disease)    Hypertension 12/20/2020   Preventative health care 08/28/2022    Patient Active Problem List   Diagnosis Date Noted   Skin lesion 09/25/2022   Preventative health care 08/28/2022   Acute pain of left knee 03/28/2021   Weight gain 12/20/2020   Hypertension 12/20/2020   Depression with anxiety 02/02/2017   Family history of heart disease in male family member before age 61 10/06/2016   Former smoker 10/06/2016   GERD (gastroesophageal reflux disease) 06/07/2009    History reviewed. No pertinent surgical history.     Home Medications    Prior to Admission medications   Medication Sig Start Date End Date Taking? Authorizing Provider  amLODipine (NORVASC) 10 MG tablet Take 1 tablet (10 mg total) by mouth daily. Due for appt 03/2023 05/25/23  Yes Sandford Craze, NP  celecoxib (CELEBREX) 200 MG capsule Take 1 capsule (200 mg total) by mouth daily for 15 days. 07/04/23 07/19/23 Yes Trevor Iha, FNP  HYDROcodone-acetaminophen (NORCO/VICODIN) 5-325 MG tablet Take 1 tablet by mouth every 8 (eight) hours as needed for up to 7 days. 07/04/23 07/11/23 Yes Trevor Iha, FNP  lisinopril (ZESTRIL) 20 MG tablet Take 1 tablet (20 mg total) by mouth daily. 06/28/23  Yes Worthy Rancher B, FNP  venlafaxine XR (EFFEXOR XR) 37.5  MG 24 hr capsule Take 1 capsule (37.5 mg total) by mouth daily for 14 days, THEN 1 capsule (37.5 mg total) every other day for 7 days, THEN 1 capsule (37.5 mg total) every 3 (three) days for 7 days, then stop. 06/29/23 07/30/23 Yes Sandford Craze, NP    Family History Family History  Problem Relation Age of Onset   Heart attack Father    Heart disease Father 86       MI   Osteoarthritis Mother    CAD Maternal Grandfather    Cancer Maternal Grandfather    Colon cancer Paternal Grandfather        died at 28    Social History Social History   Tobacco Use   Smoking status: Former    Current packs/day: 0.00    Types: Cigarettes    Quit date: 10/07/2007    Years since quitting: 15.7   Smokeless tobacco: Never  Vaping Use   Vaping status: Every Day   Substances: Nicotine  Substance Use Topics   Alcohol use: Yes    Comment: 4-5 drinks about 3 days week (hard selzers)   Drug use: No     Allergies   Patient has no known allergies.   Review of Systems Review of Systems  Musculoskeletal:        Left shoulder pain x 3 days     Physical Exam Triage Vital Signs ED Triage Vitals  Encounter Vitals Group     BP      Systolic BP Percentile      Diastolic BP Percentile      Pulse      Resp      Temp      Temp src      SpO2      Weight      Height      Head Circumference      Peak Flow      Pain Score      Pain Loc      Pain Education      Exclude from Growth Chart    No data found.  Updated Vital Signs BP 111/73 (BP Location: Left Arm)   Pulse 63   Temp 98.7 F (37.1 C) (Oral)   Resp 18   SpO2 96%   Physical Exam Vitals and nursing note reviewed.  Constitutional:      Appearance: Normal appearance. He is obese. He is not ill-appearing.  HENT:     Head: Normocephalic and atraumatic.     Mouth/Throat:     Mouth: Mucous membranes are moist.     Pharynx: Oropharynx is clear.  Eyes:     Extraocular Movements: Extraocular movements intact.      Conjunctiva/sclera: Conjunctivae normal.     Pupils: Pupils are equal, round, and reactive to light.  Cardiovascular:     Rate and Rhythm: Normal rate and regular rhythm.     Pulses: Normal pulses.     Heart sounds: Normal heart sounds.  Pulmonary:     Effort: Pulmonary effort is normal.     Breath sounds: Normal breath sounds. No wheezing, rhonchi or rales.  Musculoskeletal:        General: Normal range of motion.     Cervical back: Normal range of motion and neck supple.     Comments: Right shoulder (superior posterior aspect): Extremely TTP over surface of scapula, no soft tissue swelling, no deformity noted; patient reports moderate to severe pain with motion and at times with breathing  Skin:    General: Skin is warm and dry.  Neurological:     General: No focal deficit present.     Mental Status: He is alert and oriented to person, place, and time. Mental status is at baseline.  Psychiatric:        Mood and Affect: Mood normal.        Behavior: Behavior normal.      UC Treatments / Results  Labs (all labs ordered are listed, but only abnormal results are displayed) Labs Reviewed - No data to display  EKG   Radiology DG Shoulder Right  Result Date: 07/04/2023 CLINICAL DATA:  Right shoulder popping sensation and pain. EXAM: RIGHT SHOULDER - 2+ VIEW COMPARISON:  None Available. FINDINGS: There is no evidence of fracture or dislocation. There is no evidence of arthropathy or other focal bone abnormality. Soft tissues are unremarkable. IMPRESSION: Negative. Electronically Signed   By: Danae Orleans M.D.   On: 07/04/2023 12:48    Procedures Procedures (including critical care time)  Medications Ordered in UC Medications - No data to display  Initial Impression / Assessment and Plan / UC Course  I have reviewed the triage vital signs and the nursing notes.  Pertinent labs & imaging results that were available during my care of the patient were reviewed by me and  considered in my medical decision making (see chart for details).     MDM:  1. Acute pain of right shoulder-right shoulder x-ray results revealed above, Rx'd Norco 5/325 mg tablet: Take 1 tablet every 8 hours as needed for severe/acute right shoulder pain; 2.  Right shoulder strain, initial encounter-Rx'd Celebrex 200 mg capsule: Take 1 capsule daily x 15 days. Advised patient to take medication as directed with food to completion.  Advised patient may take Norco for breakthrough/severe right shoulder pain.  Advised patient of sedate of properties of this medication.  Encourage to increase daily water intake to 64 ounces per day while taking his medication.  Advised if symptoms worsen and/or unresolved please follow-up with [facility] for further evaluation.  Final Clinical Impressions(s) / UC Diagnoses   Final diagnoses:  Acute pain of right shoulder  Right shoulder strain, initial encounter     Discharge Instructions      Advised patient to take medication as directed with food to completion.  Advised patient may take Norco for breakthrough/severe right shoulder pain.  Advised patient of sedate of properties of this medication.  Encourage to increase daily water intake to 64 ounces per day while taking his medication.  Advised if symptoms worsen and/or unresolved please follow-up with [facility] for further evaluation.     ED Prescriptions     Medication Sig Dispense Auth. Provider   celecoxib (CELEBREX) 200 MG capsule Take 1 capsule (200 mg total) by mouth daily for 15 days. 15 capsule Trevor Iha, FNP   HYDROcodone-acetaminophen (NORCO/VICODIN) 5-325 MG tablet Take 1 tablet by mouth every 8 (eight) hours as needed for up to 7 days. 21 tablet Trevor Iha, FNP      I have reviewed the PDMP during this encounter.   Trevor Iha, FNP 07/04/23 1330

## 2023-07-12 ENCOUNTER — Other Ambulatory Visit (HOSPITAL_BASED_OUTPATIENT_CLINIC_OR_DEPARTMENT_OTHER): Payer: Self-pay

## 2023-07-12 ENCOUNTER — Encounter: Payer: Self-pay | Admitting: Family Medicine

## 2023-07-12 ENCOUNTER — Ambulatory Visit: Payer: PRIVATE HEALTH INSURANCE | Admitting: Family Medicine

## 2023-07-12 VITALS — BP 122/88 | Ht 68.0 in | Wt 225.0 lb

## 2023-07-12 DIAGNOSIS — M940 Chondrocostal junction syndrome [Tietze]: Secondary | ICD-10-CM

## 2023-07-12 DIAGNOSIS — M898X1 Other specified disorders of bone, shoulder: Secondary | ICD-10-CM | POA: Diagnosis not present

## 2023-07-12 MED ORDER — DICLOFENAC SODIUM 1 % EX GEL
2.0000 g | Freq: Four times a day (QID) | CUTANEOUS | 0 refills | Status: DC
  Filled 2023-07-12: qty 100, 12d supply, fill #0

## 2023-07-12 MED ORDER — METHOCARBAMOL 500 MG PO TABS
500.0000 mg | ORAL_TABLET | Freq: Four times a day (QID) | ORAL | 0 refills | Status: AC
  Filled 2023-07-12: qty 56, 14d supply, fill #0

## 2023-07-12 NOTE — Progress Notes (Signed)
CHIEF COMPLAINT: No chief complaint on file.  _____________________________________________________________ SUBJECTIVE  HPI  Pt is a 42 y.o. male here for evaluation of R upper back/ rib pain  Onset 1.5 weeks. Primarily pain over shoulder blade/ribs Getting over an allergy, was having an annoying tickle/cough causing 15-20 minute coughing spells. At one point he coughed and felt a pop in the posterior R shoulder region/upper-mid back region. Something moved and didn't feel right. Has had stabbing pain every time he sneezed, coughed, cleared his throat, or did anything to use his muscles. On R side when he supports himself it hurts. Able to take deep breaths. No numbness/tingling. Worried that something dislocated, but nothing feels out of place  Seen in ED 11/17, reporting coughing spells and feeling something pop in posterior R shoulder region, Rx norco and celebrex  Remedies have included: celebrex, percocet (did not take), doesn't feel like the celebrex has helped much. Rotated heat/ice. Heat seemed to help some, but short-lived. Stretching not helpful.   MHX includes HTN, GERD  ------------------------------------------------------------------------------------------------------ Past Medical History:  Diagnosis Date   Depression with anxiety    Depression with anxiety    GERD (gastroesophageal reflux disease)    Hypertension 12/20/2020   Preventative health care 08/28/2022    History reviewed. No pertinent surgical history.    Current Meds  Medication Sig   diclofenac Sodium (VOLTAREN) 1 % GEL Apply 2 g topically 4 (four) times daily.   methocarbamol (ROBAXIN) 500 MG tablet Take 1 tablet (500 mg total) by mouth 4 (four) times daily for 14 days.    ------------------------------------------------------------------------------------------------------  _____________________________________________________________ OBJECTIVE  PHYSICAL EXAM  Today's Vitals   07/12/23 1032   BP: 122/88  Weight: 225 lb (102.1 kg)  Height: 5\' 8"  (1.727 m)   Body mass index is 34.21 kg/m.   reviewed  General: A+Ox3, no acute distress, well-nourished, appropriate affect CV: pulses 2+ regular, nondiaphoretic, no peripheral edema, cap refill <2sec Lungs: no audible wheezing, non-labored breathing, bilateral chest rise/fall, nontachypneic Skin: warm, well-perfused, non-icteric, no susp lesions or rashes Neuro: no focal deficits. Sensation intact, muscle tone wnl, no atrophy Psych: no signs of depression or anxiety MSK:   R Shoulder:  No deformity, swelling or muscle wasting. No ribcage step-offs or gross abnormalities, no pain with ribcage compression with exception of direct palpation over sternocostal joints No scapular winging or dyskinesis  FF 180, abd 180, int 0, ext 90 TTP R medial scapular musculature, R sternocostal jts NTTP over the [place], clavicle, ac, coracoid, biceps groove, humerus, deltoid, subacromial space, scap spine, musculature, trap, cervical spine Neg neer, hawkins, empty can, scarf test, hornblower, resisted anterior flexion, subscap liftoff, speeds, obriens, yergason, maneuvers provocative of discomfort over shoulder blade FROM of neck  11/17 R shoulder XR independently reviewed, preserved GHJ preservation, no significant ACJ arthritis, mild humeral head sclerotic changes, unremarkable axillary view _____________________________________________________________ ASSESSMENT/PLAN Diagnoses and all orders for this visit:  Shoulder blade pain -     methocarbamol (ROBAXIN) 500 MG tablet; Take 1 tablet (500 mg total) by mouth 4 (four) times daily for 14 days. -     diclofenac Sodium (VOLTAREN) 1 % GEL; Apply 2 g topically 4 (four) times daily. -     Ambulatory referral to Physical Therapy Suspected muscular strain of medial scapular stabilizers, reassuring exam today with preserved scapular movement. Options for mgmt discussed. Robaxin Rx for intermittent muscle  spasms, reviewed dosage/frequency, possible side effects. Topical voltaren gel. PT referral.   Costochondritis -     diclofenac Sodium (  VOLTAREN) 1 % GEL; Apply 2 g topically 4 (four) times daily.  1.5 weeks. Anterior ribcage pain most consistent with costochondritis, minimal improvement with celebrex. Rx voltaren gel, otc remedies also reviewed. Discussed that pain may take a few weeks to resolve. Close monitoring, f/u if pain unchanged or worsening  Pt verbalized understanding and in agreement with plan F/u 4-6 weeks, sooner as needed.   Electronically signed by: Burna Forts, MD 07/12/2023 10:58 AM

## 2023-08-07 ENCOUNTER — Encounter: Payer: Self-pay | Admitting: Family

## 2023-08-09 ENCOUNTER — Other Ambulatory Visit (HOSPITAL_BASED_OUTPATIENT_CLINIC_OR_DEPARTMENT_OTHER): Payer: Self-pay

## 2023-08-09 ENCOUNTER — Ambulatory Visit: Payer: PRIVATE HEALTH INSURANCE | Admitting: Family Medicine

## 2023-08-09 MED ORDER — LISINOPRIL 20 MG PO TABS
20.0000 mg | ORAL_TABLET | Freq: Every day | ORAL | 0 refills | Status: DC
  Filled 2023-08-09: qty 90, 90d supply, fill #0

## 2023-09-03 ENCOUNTER — Ambulatory Visit (INDEPENDENT_AMBULATORY_CARE_PROVIDER_SITE_OTHER): Payer: PRIVATE HEALTH INSURANCE | Admitting: Family

## 2023-09-03 VITALS — BP 103/65 | HR 77 | Temp 98.8°F | Resp 16 | Ht 68.0 in | Wt 208.0 lb

## 2023-09-03 DIAGNOSIS — K219 Gastro-esophageal reflux disease without esophagitis: Secondary | ICD-10-CM

## 2023-09-03 DIAGNOSIS — F418 Other specified anxiety disorders: Secondary | ICD-10-CM

## 2023-09-03 DIAGNOSIS — I10 Essential (primary) hypertension: Secondary | ICD-10-CM

## 2023-09-03 MED ORDER — LISINOPRIL 20 MG PO TABS: 10.0000 mg | ORAL_TABLET | Freq: Every day | ORAL | Status: DC

## 2023-09-03 NOTE — Progress Notes (Signed)
Subjective:     Patient ID: John Leach, male    DOB: 11-14-1980, 43 y.o.   MRN: [id number]  Chief Complaint  Patient presents with   Hypertension    Here for follow up   Depression    Patient reports he discontinued Effexor about 3 weeks ago     Discussed the use of AI scribe software for clinical note transcription with the patient, who gave verbal consent to proceed.  History of Present Illness   The patient, with a history of hypertension and depression, presents for a follow-up visit. He reports significant weight loss, from 225 lbs to 208 lbs in seven weeks, attributed to cessation of alcohol consumption and changes in appetite possibly related to medication.    He continues amlodipine and lisinopril for hypertension.  The patient recently discontinued Effexor, with the last dose taken around November or December. He experienced a rough period of irritability and mood changes, which he attributes to withdrawal from the medication. However, he reports that his mood has improved significantly in the past week and he feels his focus at work has improved.  The patient also reports improvement in his reflux symptoms, attributing this to weight loss, cessation of alcohol, and lifestyle changes including a new bed that allows for positional adjustments.     BP Readings from Last 3 Encounters:  09/03/23 103/65  07/12/23 122/88  07/04/23 111/73   Wt Readings from Last 3 Encounters:  09/03/23 208 lb (94.3 kg)  07/12/23 225 lb (102.1 kg)  06/04/23 225 lb (102.1 kg)        Health Maintenance Due  Topic Date Due   COVID-19 Vaccine (4 - 2024-25 season) 04/18/2023    Past Medical History:  Diagnosis Date   Depression with anxiety    Depression with anxiety    GERD (gastroesophageal reflux disease)    Hypertension 12/20/2020   Preventative health care 08/28/2022    No past surgical history on file.  Family History  Problem Relation Age of Onset   Heart attack  Father    Heart disease Father 52       MI   Osteoarthritis Mother    CAD Maternal Grandfather    Cancer Maternal Grandfather    Colon cancer Paternal Grandfather        died at 63    Social History   Socioeconomic History   Marital status: Married    Spouse name: Not on file   Number of children: Not on file   Years of education: Not on file   Highest education level: Associate degree: occupational, Scientist, product/process development, or vocational program  Occupational History   Not on file  Tobacco Use   Smoking status: Former    Current packs/day: 0.00    Types: Cigarettes    Quit date: 10/07/2007    Years since quitting: 15.9   Smokeless tobacco: Never  Vaping Use   Vaping status: Every Day   Substances: Nicotine  Substance and Sexual Activity   Alcohol use: Yes    Comment: 4-5 drinks about 3 days week (hard selzers)   Drug use: No   Sexual activity: Yes    Partners: Female  Other Topics Concern   Not on file  Social History Narrative   Married   Works for a Naval architect, [employer] operator   2 girls   Completed some college   Enjoys running   Social Drivers of Health   Financial Resource Strain: Low Risk  (09/03/2023)  Overall Financial Resource Strain (CARDIA)    Difficulty of Paying Living Expenses: Not hard at all  Food Insecurity: No Food Insecurity (09/03/2023)   Hunger Vital Sign    Worried About Running Out of Food in the Last Year: Never true    Ran Out of Food in the Last Year: Never true  Transportation Needs: Patient Declined (09/03/2023)   PRAPARE - Transportation    Lack of Transportation (Medical): Patient declined    Lack of Transportation (Non-Medical): Patient declined  Physical Activity: Sufficiently Active (09/03/2023)   Exercise Vital Sign    Days of Exercise per Week: 5 days    Minutes of Exercise per Session: 60 min  Stress: No Stress Concern Present (09/03/2023)   [employer] of Occupational Health - Occupational Stress Questionnaire    Feeling of Stress  : Only a little  Social Connections: Moderately Isolated (06/01/2023)   Social Connection and Isolation Panel [NHANES]    Frequency of Communication with Friends and Family: More than three times a week    Frequency of Social Gatherings with Friends and Family: Patient declined    Attends Religious Services: Never    Database administrator or Organizations: No    Attends Engineer, structural: Not on file    Marital Status: Married  Catering manager Violence: Not on file    Outpatient Medications Prior to Visit  Medication Sig Dispense Refill   amLODipine (NORVASC) 10 MG tablet Take 1 tablet (10 mg total) by mouth daily. Due for appt 03/2023 90 tablet 0   diclofenac Sodium (VOLTAREN) 1 % GEL Apply 2 g topically 4 (four) times daily. 100 g 0   lisinopril (ZESTRIL) 20 MG tablet Take 1 tablet (20 mg total) by mouth daily. 90 tablet 0   venlafaxine XR (EFFEXOR XR) 37.5 MG 24 hr capsule Take 1 capsule (37.5 mg total) by mouth daily for 14 days, THEN 1 capsule (37.5 mg total) every other day for 7 days, THEN 1 capsule (37.5 mg total) every 3 (three) days for 7 days, then stop. 30 capsule 0   No facility-administered medications prior to visit.    No Known Allergies  Review of Systems  Psychiatric/Behavioral:  Positive for depression.        Objective:    Physical Exam Constitutional:      General: He is not in acute distress.    Appearance: He is well-developed.  HENT:     Head: Normocephalic and atraumatic.  Cardiovascular:     Rate and Rhythm: Normal rate and regular rhythm.     Heart sounds: No murmur heard. Pulmonary:     Effort: Pulmonary effort is normal. No respiratory distress.     Breath sounds: Normal breath sounds. No wheezing or rales.  Skin:    General: Skin is warm and dry.  Neurological:     Mental Status: He is alert and oriented to person, place, and time.  Psychiatric:        Behavior: Behavior normal.        Thought Content: Thought content normal.       BP 103/65 (BP Location: Right Arm, Patient Position: Sitting, Cuff Size: Large)   Pulse 77   Temp 98.8 F (37.1 C) (Oral)   Resp 16   Ht 5\' 8"  (1.727 m)   Wt 208 lb (94.3 kg)   SpO2 97%   BMI 31.63 kg/m  Wt Readings from Last 3 Encounters:  09/03/23 208 lb (94.3 kg)  07/12/23 225 lb (  102.1 kg)  06/04/23 225 lb (102.1 kg)       Assessment & Plan:   Problem List Items Addressed This Visit       Unprioritized   Hypertension    Blood pressure low today, possibly due to recent weight loss and cessation of alcohol. Currently on Lisinopril 20mg  and Amlodipine 10mg . -Reduce Lisinopril to half tablet daily 20mg  to 10mg .  -Check blood pressure at home after making adjustment and send me his reading via mychart. Plan to recheck in office in 3 months.      Relevant Medications   lisinopril (ZESTRIL) 20 MG tablet   GERD (gastroesophageal reflux disease)   Improved.        Depression with anxiety - Primary   Feels well off of effexor. Focusing better at work.  Will continue to monitor off of medication.        I have discontinued Zvi A. Meineke's venlafaxine XR. I have also changed his lisinopril. Additionally, I am having him maintain his amLODipine and diclofenac Sodium.  Meds ordered this encounter  Medications   lisinopril (ZESTRIL) 20 MG tablet    Sig: Take 0.5 tablets (10 mg total) by mouth daily.    Supervising Provider:   Danise Edge A [phone]

## 2023-09-03 NOTE — Assessment & Plan Note (Signed)
  Blood pressure low today, possibly due to recent weight loss and cessation of alcohol. Currently on Lisinopril 20mg  and Amlodipine 10mg . -Reduce Lisinopril to half tablet daily 20mg  to 10mg .  -Check blood pressure at home after making adjustment and send me his reading via mychart. Plan to recheck in office in 3 months.

## 2023-09-03 NOTE — Assessment & Plan Note (Signed)
 Improved

## 2023-09-03 NOTE — Patient Instructions (Signed)
VISIT SUMMARY:  During today's follow-up visit, we discussed your recent weight loss, changes in blood pressure, and improvements in mood and reflux symptoms. You have made significant lifestyle changes, including stopping alcohol consumption and losing weight which have positively impacted your health. Great work!  YOUR PLAN:  -HYPERTENSION: Hypertension, or high blood pressure, was found to be low today, likely due to your recent weight loss and stopping alcohol. We will reduce your Lisinopril dose to half a tablet daily. Please check your blood pressure at home if possible and we will recheck it in the office in 3 months.  -DEPRESSION/ANXIETY: You recently stopped taking Effexor, which initially caused irritability and mood changes, but your mood and focus have improved. Continue to stay off Effexor and contact our office if you experience any mood issues.  -GASTROESOPHAGEAL REFLUX DISEASE (GERD): GERD, or acid reflux, has improved with your lifestyle changes, including weight loss, stopping alcohol, and adjusting your sleeping position. Continue with your current management.  -GENERAL HEALTH MAINTENANCE: Consider getting the updated COVID booster if you haven't already. Also, schedule a physical exam and cholesterol check in 3 months.  INSTRUCTIONS:  Please check your blood pressure at home if possible and schedule a physical with BP follow up in 3 months. Also, schedule a physical exam and cholesterol check in 3 months.

## 2023-09-03 NOTE — Assessment & Plan Note (Addendum)
Feels well off of effexor. Focusing better at work.  Will continue to monitor off of medication.

## 2023-10-04 ENCOUNTER — Other Ambulatory Visit (HOSPITAL_BASED_OUTPATIENT_CLINIC_OR_DEPARTMENT_OTHER): Payer: Self-pay

## 2023-10-04 ENCOUNTER — Other Ambulatory Visit: Payer: Self-pay | Admitting: Family

## 2023-10-04 MED ORDER — AMLODIPINE BESYLATE 10 MG PO TABS
10.0000 mg | ORAL_TABLET | Freq: Every day | ORAL | 0 refills | Status: DC
  Filled 2023-10-04: qty 90, 90d supply, fill #0

## 2023-11-29 ENCOUNTER — Ambulatory Visit: Payer: PRIVATE HEALTH INSURANCE | Admitting: Family

## 2023-12-31 ENCOUNTER — Other Ambulatory Visit (HOSPITAL_COMMUNITY): Payer: Self-pay

## 2024-01-04 ENCOUNTER — Other Ambulatory Visit (HOSPITAL_BASED_OUTPATIENT_CLINIC_OR_DEPARTMENT_OTHER): Payer: Self-pay

## 2024-01-04 MED ORDER — AMOXICILLIN 875 MG PO TABS
1750.0000 mg | ORAL_TABLET | Freq: Two times a day (BID) | ORAL | 0 refills | Status: DC
  Filled 2024-01-04: qty 14, 4d supply, fill #0

## 2024-01-04 MED ORDER — METHYLPREDNISOLONE 4 MG PO TBPK
ORAL_TABLET | ORAL | 0 refills | Status: DC
  Filled 2024-01-04 – 2024-01-06 (×2): qty 21, 6d supply, fill #0

## 2024-01-06 ENCOUNTER — Other Ambulatory Visit (HOSPITAL_BASED_OUTPATIENT_CLINIC_OR_DEPARTMENT_OTHER): Payer: Self-pay

## 2024-02-02 ENCOUNTER — Other Ambulatory Visit: Payer: Self-pay | Admitting: Family

## 2024-02-02 ENCOUNTER — Other Ambulatory Visit (HOSPITAL_BASED_OUTPATIENT_CLINIC_OR_DEPARTMENT_OTHER): Payer: Self-pay

## 2024-02-02 MED ORDER — AMLODIPINE BESYLATE 10 MG PO TABS
10.0000 mg | ORAL_TABLET | Freq: Every day | ORAL | 0 refills | Status: DC
  Filled 2024-02-02: qty 30, 30d supply, fill #0

## 2024-02-28 ENCOUNTER — Other Ambulatory Visit: Payer: Self-pay | Admitting: Family

## 2024-02-28 MED ORDER — AMLODIPINE BESYLATE 10 MG PO TABS
10.0000 mg | ORAL_TABLET | Freq: Every day | ORAL | 0 refills | Status: DC
  Filled 2024-02-28: qty 30, 30d supply, fill #0

## 2024-02-29 ENCOUNTER — Other Ambulatory Visit (HOSPITAL_BASED_OUTPATIENT_CLINIC_OR_DEPARTMENT_OTHER): Payer: Self-pay

## 2024-04-07 ENCOUNTER — Ambulatory Visit: Payer: PRIVATE HEALTH INSURANCE | Admitting: Family

## 2024-04-07 ENCOUNTER — Other Ambulatory Visit (HOSPITAL_BASED_OUTPATIENT_CLINIC_OR_DEPARTMENT_OTHER): Payer: Self-pay

## 2024-04-07 VITALS — BP 126/81 | HR 52 | Temp 98.6°F | Resp 16 | Ht 68.0 in | Wt 176.2 lb

## 2024-04-07 DIAGNOSIS — Z0001 Encounter for general adult medical examination with abnormal findings: Secondary | ICD-10-CM

## 2024-04-07 DIAGNOSIS — M180 Bilateral primary osteoarthritis of first carpometacarpal joints: Secondary | ICD-10-CM

## 2024-04-07 DIAGNOSIS — I10 Essential (primary) hypertension: Secondary | ICD-10-CM | POA: Diagnosis not present

## 2024-04-07 DIAGNOSIS — Z0000 Encounter for general adult medical examination without abnormal findings: Secondary | ICD-10-CM

## 2024-04-07 DIAGNOSIS — R634 Abnormal weight loss: Secondary | ICD-10-CM | POA: Diagnosis not present

## 2024-04-07 DIAGNOSIS — F418 Other specified anxiety disorders: Secondary | ICD-10-CM

## 2024-04-07 LAB — LIPID PANEL
Cholesterol: 171 mg/dL (ref 0–200)
HDL: 48.3 mg/dL (ref >39.00)
LDL Cholesterol: 115 mg/dL — ABNORMAL HIGH (ref 0–99)
NonHDL: 122.9
Total CHOL/HDL Ratio: 4
Triglycerides: 38 mg/dL (ref 0.0–149.0)
VLDL: 7.6 mg/dL (ref 0.0–40.0)

## 2024-04-07 LAB — COMPREHENSIVE METABOLIC PANEL WITH GFR
ALT: 18 U/L (ref 0–53)
AST: 17 U/L (ref 0–37)
Albumin: 4.6 g/dL (ref 3.5–5.2)
Alkaline Phosphatase: 65 U/L (ref 39–117)
BUN: 10 mg/dL (ref 6–23)
CO2: 28 meq/L (ref 19–32)
Calcium: 9.2 mg/dL (ref 8.4–10.5)
Chloride: 104 meq/L (ref 96–112)
Creatinine, Ser: 0.82 mg/dL (ref 0.40–1.50)
GFR: 107.09 mL/min (ref >60.00)
Glucose, Bld: 93 mg/dL (ref 70–99)
Potassium: 4.5 meq/L (ref 3.5–5.1)
Sodium: 141 meq/L (ref 135–145)
Total Bilirubin: 0.6 mg/dL (ref 0.2–1.2)
Total Protein: 6.7 g/dL (ref 6.0–8.3)

## 2024-04-07 LAB — CBC WITH DIFFERENTIAL/PLATELET
Absolute Lymphocytes: 1493 {cells}/uL (ref 850–3900)
Absolute Monocytes: 398 {cells}/uL (ref 200–950)
Basophils Absolute: 82 {cells}/uL (ref 0–200)
Basophils Relative: 1.7 %
Eosinophils Absolute: 91 {cells}/uL (ref 15–500)
Eosinophils Relative: 1.9 %
HCT: 43.6 % (ref 38.5–50.0)
Hemoglobin: 14.4 g/dL (ref 13.2–17.1)
MCH: 29.1 pg (ref 27.0–33.0)
MCHC: 33 g/dL (ref 32.0–36.0)
MCV: 88.3 fL (ref 80.0–100.0)
MPV: 10.4 fL (ref 7.5–12.5)
Monocytes Relative: 8.3 %
Neutro Abs: 2736 {cells}/uL (ref 1500–7800)
Neutrophils Relative %: 57 %
Platelets: 240 Thousand/uL (ref 140–400)
RBC: 4.94 Million/uL (ref 4.20–5.80)
RDW: 12.7 % (ref 11.0–15.0)
Total Lymphocyte: 31.1 %
WBC: 4.8 Thousand/uL (ref 3.8–10.8)

## 2024-04-07 LAB — TSH: TSH: 1.52 u[IU]/mL (ref 0.35–5.50)

## 2024-04-07 MED ORDER — AMLODIPINE BESYLATE 10 MG PO TABS
10.0000 mg | ORAL_TABLET | Freq: Every day | ORAL | 1 refills | Status: AC
  Filled 2024-04-07: qty 90, 90d supply, fill #0
  Filled 2024-07-11: qty 90, 90d supply, fill #1

## 2024-04-07 NOTE — Assessment & Plan Note (Addendum)
 BP Readings from Last 3 Encounters:  04/07/24 126/81  09/03/23 103/65  07/12/23 122/88   Stable, continue amlodipine . OK to remain off of lisinopril .

## 2024-04-07 NOTE — Progress Notes (Signed)
 Subjective:     Patient ID: John Leach, male    DOB: 13-Sep-1980, 43 y.o.   MRN: [id number]  Chief Complaint  Patient presents with   Annual Exam    HPI  Discussed the use of AI scribe software for clinical note transcription with the patient, who gave verbal consent to proceed.  History of Present Illness  John Leach is a 43 year old male who presents for an annual physical exam.  He has experienced significant weight loss, from 208 pounds to 176 pounds, attributed to lifestyle changes such as reduced food intake and increased activity. He is concerned about the rapidity of this weight loss. He has abstained from alcohol since January of last year, which he believes may also contribute to the weight loss.  He experiences pain in his thumbs, particularly when gripping and pushing, which he attributes to possible arthritis.  He is taking amlodipine  10 mg for blood pressure management. He previously took lisinopril  but has not been on it recently due to lack of refills. He monitors his blood pressure at home but finds the readings from his digital cuff inconsistent with those taken in the office.  He has no current cough, cold symptoms, skin concerns, digestive issues, urinary issues, unusual muscle or joint pain elsewhere, frequent headaches, or concerns about depression or anxiety.     Health Maintenance Due  Topic Date Due   COVID-19 Vaccine (4 - 2024-25 season) 04/18/2023   INFLUENZA VACCINE  03/17/2024    Past Medical History:  Diagnosis Date   Depression with anxiety    Depression with anxiety    GERD (gastroesophageal reflux disease)    Hypertension 12/20/2020   Preventative health care 08/28/2022    History reviewed. No pertinent surgical history.  Family History  Problem Relation Age of Onset   Heart attack Father    Heart disease Father 2       MI   Osteoarthritis Mother    CAD Maternal Grandfather    Cancer Maternal Grandfather    Colon cancer  Paternal Grandfather        died at 74    Social History   Socioeconomic History   Marital status: Married    Spouse name: Not on file   Number of children: Not on file   Years of education: Not on file   Highest education level: Associate degree: occupational, Scientist, product/process development, or vocational program  Occupational History   Not on file  Tobacco Use   Smoking status: Former    Current packs/day: 0.00    Types: Cigarettes    Quit date: 10/07/2007    Years since quitting: 16.5   Smokeless tobacco: Never  Vaping Use   Vaping status: Every Day   Substances: Nicotine  Substance and Sexual Activity   Alcohol use: Not Currently   Drug use: No   Sexual activity: Yes    Partners: Female  Other Topics Concern   Not on file  Social History Narrative   Married   Works for a Naval architect, [employer] operator   2 girls   Completed some college   Enjoys running   Social Drivers of Health   Financial Resource Strain: Low Risk  (04/07/2024)   Overall Financial Resource Strain (CARDIA)    Difficulty of Paying Living Expenses: Not hard at all  Food Insecurity: No Food Insecurity (04/07/2024)   Hunger Vital Sign    Worried About Running Out of Food in the Last Year: Never true  Ran Out of Food in the Last Year: Never true  Transportation Needs: No Transportation Needs (04/07/2024)   PRAPARE - Administrator, Civil Service (Medical): No    Lack of Transportation (Non-Medical): No  Physical Activity: Sufficiently Active (04/07/2024)   Exercise Vital Sign    Days of Exercise per Week: 5 days    Minutes of Exercise per Session: 30 min  Stress: No Stress Concern Present (04/07/2024)   [employer] of Occupational Health - Occupational Stress Questionnaire    Feeling of Stress: Only a little  Social Connections: Moderately Isolated (04/07/2024)   Social Connection and Isolation Panel    Frequency of Communication with Friends and Family: Twice a week    Frequency of Social Gatherings  with Friends and Family: Once a week    Attends Religious Services: Never    Database administrator or Organizations: No    Attends Engineer, structural: Not on file    Marital Status: Married  Catering manager Violence: Not on file    Outpatient Medications Prior to Visit  Medication Sig Dispense Refill   amLODipine  (NORVASC ) 10 MG tablet Take 1 tablet (10 mg total) by mouth daily. 30 tablet 0   lisinopril  (ZESTRIL ) 20 MG tablet Take 0.5 tablets (10 mg total) by mouth daily.     amoxicillin  (AMOXIL ) 875 MG tablet Take 2 tablets (1,750 mg total) by mouth 2 (two) times daily until all taken. 14 tablet 0   diclofenac  Sodium (VOLTAREN ) 1 % GEL Apply 2 g topically 4 (four) times daily. 100 g 0   methylPREDNISolone  (MEDROL  DOSEPAK) 4 MG TBPK tablet Take as directed on package. 21 tablet 0   No facility-administered medications prior to visit.    No Known Allergies  Review of Systems  Constitutional:  Positive for weight loss.  HENT:  Negative for congestion and hearing loss.   Eyes:  Negative for blurred vision.  Respiratory:  Negative for cough.   Cardiovascular:  Negative for leg swelling.  Gastrointestinal:  Negative for constipation and diarrhea.  Genitourinary:  Negative for dysuria and frequency.  Musculoskeletal:  Positive for joint pain (base of both thumbs). Negative for myalgias.  Skin:  Negative for rash.  Neurological:  Negative for headaches.  Psychiatric/Behavioral:  Negative for depression. The patient is not nervous/anxious.        Objective:    Physical Exam   BP 126/81 (BP Location: Right Arm, Patient Position: Sitting, Cuff Size: Normal)   Pulse (!) 52   Temp 98.6 F (37 C) (Oral)   Resp 16   Ht 5' 8 (1.727 m)   Wt 176 lb 3.2 oz (79.9 kg)   SpO2 97%   BMI 26.79 kg/m  Wt Readings from Last 3 Encounters:  04/07/24 176 lb 3.2 oz (79.9 kg)  09/03/23 208 lb (94.3 kg)  07/12/23 225 lb (102.1 kg)   Physical Exam  Constitutional: He is oriented  to person, place, and time. He appears well-developed and well-nourished. No distress.  HENT:  Head: Normocephalic and atraumatic.  Right Ear: Tympanic membrane and ear canal normal.  Left Ear: Tympanic membrane and ear canal normal.  Mouth/Throat: Oropharynx is clear and moist.  Eyes: Pupils are equal, round, and reactive to light. No scleral icterus.  Neck: Normal range of motion. No thyromegaly present.  Cardiovascular: Normal rate and regular rhythm.   No murmur heard. Pulmonary/Chest: Effort normal and breath sounds normal. No respiratory distress. He has no wheezes. He has no rales.  He exhibits no tenderness.  Abdominal: Soft. Bowel sounds are normal. He exhibits no distension and no mass. There is no tenderness. There is no rebound and no guarding.  Musculoskeletal: He exhibits no edema.  Lymphadenopathy:    He has no cervical adenopathy.  Neurological: He is alert and oriented to person, place, and time. He has normal patellar reflexes. He exhibits normal muscle tone. Coordination normal.  Skin: Skin is warm and dry.  Psychiatric: He has a normal mood and affect. His behavior is normal. Judgment and thought content normal.           Assessment & Plan:       Assessment & Plan:   Problem List Items Addressed This Visit       Unprioritized   Weight loss   I think that this is due to his lifestyle changes but he would like to make sure that no other medical issues are potentially contributing. Obtain labs as ordered.       Relevant Orders   CBC with Differential/Platelet   Comp Met (CMET)   TSH   Lipid panel   Preventative health care - Primary    Routine wellness visit with stable health parameters and no significant concerns. - Encourage regular dental check-ups. - Recommend vision check-up  - Encourage continued healthy lifestyle choices. - Declines Gardisil/Hep B vaccine- other vaccines UTD.   - Plan to start colonoscopy next year.       Relevant Orders    CBC with Differential/Platelet   Comp Met (CMET)   TSH   Lipid panel   Hypertension   BP Readings from Last 3 Encounters:  04/07/24 126/81  09/03/23 103/65  07/12/23 122/88   Stable, continue amlodipine . OK to remain off of lisinopril .       Relevant Medications   amLODipine  (NORVASC ) 10 MG tablet   Depression with anxiety   Stable without medication. He has significantly less stress with the improvement in his work commute and new job.       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        I have discontinued Dhanvin A. Weant's diclofenac  Sodium, lisinopril , amoxicillin , and methylPREDNISolone . I am also having him maintain his amLODipine .  Meds ordered this encounter  Medications   amLODipine  (NORVASC ) 10 MG tablet    Sig: Take 1 tablet (10 mg total) by mouth daily.    Dispense:  90 tablet    Refill:  1    Supervising Provider:   DOMENICA BLACKBIRD A [[id number]]

## 2024-04-07 NOTE — Assessment & Plan Note (Signed)
 I think that this is due to his lifestyle changes but he would like to make sure that no other medical issues are potentially contributing. Obtain labs as ordered.

## 2024-04-07 NOTE — Patient Instructions (Signed)
 VISIT SUMMARY:  You had your annual physical exam today. Your health parameters are stable, but you have experienced significant weight loss and thumb pain. Your blood pressure is well-controlled with your current medication.  YOUR PLAN:  WEIGHT LOSS: You have lost weight from 208 lbs to 176 lbs, likely due to lifestyle changes. This is great for your health! -We will order thyroid  function tests to rule out any thyroid  issues. -Please continue to monitor your weight and lifestyle changes.  ESSENTIAL HYPERTENSION: Your blood pressure is well-controlled with your current medication, amlodipine . -Continue taking amlodipine  10 mg daily. -We have sent refills for amlodipine  to your pharmacy.  THUMB OSTEOARTHRITIS: You have pain in your thumbs, likely due to osteoarthritis. -Monitor your symptoms and manage with lifestyle modifications as needed.  GENERAL HEALTH MAINTENANCE: We discussed your vaccinations and general health maintenance. -You declined hepatitis B and HPV vaccines. -We encourage you to get a flu shot in September or October. -Please remember to have regular dental check-ups and a vision check-up every couple of years.

## 2024-04-07 NOTE — Assessment & Plan Note (Signed)
  Routine wellness visit with stable health parameters and no significant concerns. - Encourage regular dental check-ups. - Recommend vision check-up  - Encourage continued healthy lifestyle choices. - Declines Gardisil/Hep B vaccine- other vaccines UTD.   - Plan to start colonoscopy next year.

## 2024-04-07 NOTE — Assessment & Plan Note (Signed)
 Stable without medication. He has significantly less stress with the improvement in his work commute and new job.

## 2024-04-07 NOTE — Assessment & Plan Note (Signed)
 Offered referral to Ortho- he declines at this time as he is managing on his own.

## 2024-04-08 ENCOUNTER — Ambulatory Visit: Payer: Self-pay | Admitting: Family

## 2024-06-02 ENCOUNTER — Other Ambulatory Visit (HOSPITAL_BASED_OUTPATIENT_CLINIC_OR_DEPARTMENT_OTHER): Payer: Self-pay

## 2024-06-02 MED ORDER — AMOXICILLIN-POT CLAVULANATE 500-125 MG PO TABS
1.0000 | ORAL_TABLET | Freq: Three times a day (TID) | ORAL | 0 refills | Status: AC
  Filled 2024-06-02: qty 21, 7d supply, fill #0

## 2024-08-05 ENCOUNTER — Other Ambulatory Visit (HOSPITAL_BASED_OUTPATIENT_CLINIC_OR_DEPARTMENT_OTHER): Payer: Self-pay

## 2024-08-05 MED ORDER — OSELTAMIVIR PHOSPHATE 75 MG PO CAPS
75.0000 mg | ORAL_CAPSULE | Freq: Two times a day (BID) | ORAL | 0 refills | Status: AC
  Filled 2024-08-05: qty 10, 5d supply, fill #0

## 2024-10-13 ENCOUNTER — Ambulatory Visit: Payer: PRIVATE HEALTH INSURANCE | Admitting: Family
